# Patient Record
Sex: Female | Born: 1960
Health system: Southern US, Community
[De-identification: ages and names within clinical notes are randomized; demographics above are authoritative.]

## PROBLEM LIST (undated history)

## (undated) DIAGNOSIS — F32A Depression, unspecified: Secondary | ICD-10-CM

## (undated) DIAGNOSIS — E079 Disorder of thyroid, unspecified: Secondary | ICD-10-CM

## (undated) DIAGNOSIS — F329 Major depressive disorder, single episode, unspecified: Secondary | ICD-10-CM

## (undated) DIAGNOSIS — E538 Deficiency of other specified B group vitamins: Secondary | ICD-10-CM

---

## 1995-12-29 HISTORY — PX: LAPAROSCOPY: SHX197

## 1999-06-09 ENCOUNTER — Other Ambulatory Visit: Admission: RE | Admit: 1999-06-09 | Discharge: 1999-06-09 | Payer: Self-pay | Admitting: Gynecology

## 2001-06-14 ENCOUNTER — Other Ambulatory Visit: Admission: RE | Admit: 2001-06-14 | Discharge: 2001-06-14 | Payer: Self-pay | Admitting: Gynecology

## 2002-06-26 ENCOUNTER — Other Ambulatory Visit: Admission: RE | Admit: 2002-06-26 | Discharge: 2002-06-26 | Payer: Self-pay | Admitting: Gynecology

## 2002-10-02 ENCOUNTER — Other Ambulatory Visit: Admission: RE | Admit: 2002-10-02 | Discharge: 2002-10-02 | Payer: Self-pay | Admitting: Gynecology

## 2003-06-28 ENCOUNTER — Other Ambulatory Visit: Admission: RE | Admit: 2003-06-28 | Discharge: 2003-06-28 | Payer: Self-pay | Admitting: Gynecology

## 2003-10-04 ENCOUNTER — Ambulatory Visit (HOSPITAL_COMMUNITY): Admission: RE | Admit: 2003-10-04 | Discharge: 2003-10-04 | Payer: Self-pay | Admitting: Gynecology

## 2003-10-04 ENCOUNTER — Encounter: Payer: Self-pay | Admitting: Gynecology

## 2004-07-01 ENCOUNTER — Ambulatory Visit (HOSPITAL_BASED_OUTPATIENT_CLINIC_OR_DEPARTMENT_OTHER): Admission: RE | Admit: 2004-07-01 | Discharge: 2004-07-01 | Payer: Self-pay | Admitting: Gynecology

## 2004-07-01 ENCOUNTER — Encounter (INDEPENDENT_AMBULATORY_CARE_PROVIDER_SITE_OTHER): Payer: Self-pay | Admitting: *Deleted

## 2004-07-01 ENCOUNTER — Ambulatory Visit (HOSPITAL_COMMUNITY): Admission: RE | Admit: 2004-07-01 | Discharge: 2004-07-01 | Payer: Self-pay | Admitting: Gynecology

## 2004-08-12 ENCOUNTER — Other Ambulatory Visit: Admission: RE | Admit: 2004-08-12 | Discharge: 2004-08-12 | Payer: Self-pay | Admitting: Gynecology

## 2004-12-18 ENCOUNTER — Ambulatory Visit (HOSPITAL_COMMUNITY): Admission: RE | Admit: 2004-12-18 | Discharge: 2004-12-18 | Payer: Self-pay | Admitting: Gynecology

## 2005-08-14 ENCOUNTER — Other Ambulatory Visit: Admission: RE | Admit: 2005-08-14 | Discharge: 2005-08-14 | Payer: Self-pay | Admitting: Gynecology

## 2006-01-22 ENCOUNTER — Ambulatory Visit: Payer: Self-pay | Admitting: Internal Medicine

## 2006-01-29 ENCOUNTER — Ambulatory Visit: Payer: Self-pay | Admitting: Internal Medicine

## 2006-02-26 ENCOUNTER — Ambulatory Visit (HOSPITAL_COMMUNITY): Admission: RE | Admit: 2006-02-26 | Discharge: 2006-02-26 | Payer: Self-pay | Admitting: Gynecology

## 2006-08-18 ENCOUNTER — Other Ambulatory Visit: Admission: RE | Admit: 2006-08-18 | Discharge: 2006-08-18 | Payer: Self-pay | Admitting: Gynecology

## 2007-05-16 ENCOUNTER — Ambulatory Visit (HOSPITAL_COMMUNITY): Admission: RE | Admit: 2007-05-16 | Discharge: 2007-05-16 | Payer: Self-pay | Admitting: Gynecology

## 2007-06-15 ENCOUNTER — Ambulatory Visit: Payer: Self-pay | Admitting: Internal Medicine

## 2007-06-17 DIAGNOSIS — F329 Major depressive disorder, single episode, unspecified: Secondary | ICD-10-CM

## 2007-06-17 DIAGNOSIS — F339 Major depressive disorder, recurrent, unspecified: Secondary | ICD-10-CM | POA: Insufficient documentation

## 2007-06-17 DIAGNOSIS — E039 Hypothyroidism, unspecified: Secondary | ICD-10-CM | POA: Insufficient documentation

## 2007-09-01 ENCOUNTER — Other Ambulatory Visit: Admission: RE | Admit: 2007-09-01 | Discharge: 2007-09-01 | Payer: Self-pay | Admitting: Gynecology

## 2007-11-17 ENCOUNTER — Telehealth: Payer: Self-pay | Admitting: Internal Medicine

## 2008-01-09 ENCOUNTER — Telehealth: Payer: Self-pay | Admitting: Internal Medicine

## 2008-05-16 ENCOUNTER — Ambulatory Visit (HOSPITAL_COMMUNITY): Admission: RE | Admit: 2008-05-16 | Discharge: 2008-05-16 | Payer: Self-pay | Admitting: Gynecology

## 2011-05-15 NOTE — H&P (Signed)
NAME:  Laura Howell, Laura Howell                               ACCOUNT NO.:  [id number]   MEDICAL RECORD NO.:  [id number]                   PATIENT TYPE:  AMB   LOCATION:  [facility]                                 FACILITY:  [facility]   PHYSICIAN:  Ivor Costa. Farrel Gobble, M.D.              DATE OF BIRTH:  17-Nov-1961   DATE OF ADMISSION:  DATE OF DISCHARGE:                                HISTORY & PHYSICAL   CHIEF COMPLAINT:  1. Chronic left lower quadrant pain.  2. History of endometriosis.   HISTORY OF PRESENT ILLNESS:  The patient is a 50 year old G0 who was  diagnosed with endometriosis by laparoscopy in 1990.  She had been  suppressed on Micronor and Depo-Provera and then was most recently switched  over to Alesse several years ago and had done very well.  However, the  patient has been having this chronic left lower quadrant pain that she has  been complaining of for the past several years after review of the chart.  The patient had been on continuous OC's and discontinued them in an attempt  to get a cycle and then forget to restart them for over 2-and-a-hafl weeks  and at the point when she presented to our office she had restarted the pill  but was still continuing to have the left lower quadrant pain which was  actually quite severe at that point.  She also had complained of some  bloating.  An ultrasound done at that time failed to show any ovarian cyst  on the left.  There is some posterior cul-de-sac fluid which is recurrent on  all of her scans dating back to 1999 which would be consistent with the  disease.  She is nulliparous by choice.   PAST OBSTETRICAL AND GYNECOLOGICAL HISTORY:  As above.  Her cycles have been  completely suppressed for a number of years.  She has no history of abnormal  Pap smears.   PAST MEDICAL HISTORY:  Significant for hypothyroidism, depression, and  endometriosis.   PAST SURGICAL HISTORY:  Significant for the laparoscopy in 1990.   SOCIAL HISTORY:  She is married.   No alcohol, tobacco, or caffeine.  She  does regular exercise.   FAMILY HISTORY:  Negative for GYN cancers.   MEDICATIONS:  She is on:  1. Wellbutrin SR 150.  2. Lexapro 10 mg daily.  3. Alesse one p.o. daily.  4. Trazodone 50 mg p.o. at h.s..  5. Ambien 10 mg p.o. at has p.r.n.  6. Unithroid 0.088.   PHYSICAL EXAMINATION:  GENERAL:  She is a well-appearing female in no acute  distress.  HEART:  Regular rate.  LUNGS:  Clear to auscultation.  ABDOMEN:  Soft with some tenderness in the left lower quadrant without any  rebound or guarding.  PELVIC:  She has normal external female genitalia.  The BUS is negative.  In  the vagina there was  a scant amount of discharge.  The speculum exam was  uncomfortable to her.  On bimanual exam there is some slight cervical motion  tenderness.  The uterus itself was nontender.  The right adnexa and sidewall  were also nontender.  There is tenderness on the left side.  Rectovaginal  exam confirmed a lack of symptoms on the right including the right  uterosacral ligament which were both nontender.  However, the left  uterosacral ligament was tender.  There was a band pulling from the sidewall  that was extremely painful to her on the left side.  The left ovary was also  tender.   ASSESSMENT:  Known history of endometriosis with chronic history of left  lower quadrant pain that has been exacerbated in recent weeks without any  resolution of her symptoms with return to oral contraceptives.  The patient  will present for a laparoscopic fulguration of endometriosis and removal of  the left adnexa.  All questions were addressed.                                               Ivor Costa. Farrel Gobble, M.D.    Leda Roys  D:  06/26/2004  T:  06/26/2004  Job:  [id number]

## 2011-05-15 NOTE — Op Note (Signed)
NAME:  Laura Howell, Laura Howell                               ACCOUNT NO.:  [id number]   MEDICAL RECORD NO.:  [id number]                   PATIENT TYPE:  AMB   LOCATION:  [facility]                                 FACILITY:  [facility]   PHYSICIAN:  Ivor Costa. Farrel Gobble, M.D.              DATE OF BIRTH:  18-Apr-1961   DATE OF PROCEDURE:  07/01/2004  DATE OF DISCHARGE:                                 OPERATIVE REPORT   PREOPERATIVE DIAGNOSIS:  1. Endometriosis.  2. Chronic left lower quadrant pain.   POSTOPERATIVE DIAGNOSES:  1. Endometriosis.  2. Chronic left lower quadrant pain.   PROCEDURE:  1. Laparoscopic left salpingo-oophorectomy.  2. Excision of endometriotic implants.   SURGEON:  Ivor Costa. Farrel Gobble, M.D.   ASSISTANTMarcial Pacas P. Fontaine, M.D.   ANESTHESIA:  General.   ESTIMATED BLOOD LOSS:  Minimal.   FINDINGS:  There were deep endometriotic implants bilaterally over the  uterosacral ligaments, with the left more involved than the right.  Normal  ureteral peristalsis is appreciated.  Both adnexa were mobile.  There was a  questionable small lesion on the right ovary.  The anterior cul-de-sac,  liver, gallbladder and appendix were normal.   PROCEDURE:  The patient was taken to the operating room.  General anesthesia  was induced.  He was placed in the dorsal lithotomy position and prepped and  draped in the usual sterile fashion.  A sterile speculum was placed in the  vagina.  The cervix was visualized, stabilized with a single-toothed  tenaculum and the uterine manipulator was then placed.   Gloves were changed and attention was then turned to the abdomen.  An  infraumbilical incision was made with the scalpel, through which the Veress  needle was placed.  Free flow of fluid was noted.  Opening pressure was 2.  A pneumoperitoneum was created until tympany was appreciated above the  liver, after which a 10-11 disposable trocar was inserted through the  infraumbilical port.  Placement in the  abdomen was confirmed.  Then, two 5  mm lower ports were placed under direct visualization, using these two lower  ports we were able to manipulate and examine the pelvis.  The tube and ovary  on both sides were noted to be mobile.  There were deep endometriotic  implants bilaterally over the uterosacral ligaments, with some banding noted  on the left-hand side.  Because of the exquisite tenderness of the left hand  side, and the band that was appreciated on the examination in the office, I  elected to sharply excise both of these lesions.  The ureter was visualized  peristalsing and well superior to this area.  The endometriotic implant was  then grasped with an atraumatic grasper, elevated and sharply excised off  with the cautery and shears.  Similarly, the endometriotic implant of the  right uterosacral ligament was treated, again peristalsis was appreciated  and  prior to the excision.  Both excisions were noted to be hemostatic  afterwards.   Because we had agreed upon removing the left ovary, regardless of findings,  we then proceeded to remove the left ovary.  The mesosalpinx tube and  ovarian-uterine ligament were treated with cautery, and then sharply excised  with the shears.  The infundibular pelvic ligament was also cauterized and  then sharply dissected off.  This was carried through until the entire tube  and ovary were removed.  The pedicle was noted to be hemostatic throughout.  An Endo pouch was then advanced through the infraumbilical port.  A #5 scope  was then advanced through the one of the lower ports, and we were able to  place the specimen in the bag and remove it from the pelvis.  The  infraumbilical port was then replaced.   Confirmation of hemostasis at all operative sites was appreciated.  The  pelvis was irrigated with copious amounts of warm saline.  The upper abdomen  was inspected and noted to be unremarkable.  The instruments were then  removed under  direct visualization.  The fascia of the infraumbilical port  was closed with figure-of-eight of 0 Vicryl.  The skin was closed with 3-0  plain.  The lower ports were reapproximated with Steri-Strips with tincture  of Benzoin.   DISPOSITION:  The patient tolerated the procedure well.  Sponge, lap and  needle counts were correct x2.  She was transferred to the PACU in stable  condition.                                               Ivor Costa. Farrel Gobble, M.D.    Leda Roys  D:  07/01/2004  T:  07/01/2004  Job:  [id number]

## 2018-02-16 ENCOUNTER — Emergency Department (HOSPITAL_BASED_OUTPATIENT_CLINIC_OR_DEPARTMENT_OTHER): Payer: BLUE CROSS/BLUE SHIELD

## 2018-02-16 ENCOUNTER — Emergency Department (HOSPITAL_BASED_OUTPATIENT_CLINIC_OR_DEPARTMENT_OTHER)
Admission: EM | Admit: 2018-02-16 | Discharge: 2018-02-17 | Disposition: A | Discharge status: Home / Self Care | Payer: BLUE CROSS/BLUE SHIELD | Attending: Emergency Medicine | Admitting: Emergency Medicine

## 2018-02-16 ENCOUNTER — Encounter (HOSPITAL_BASED_OUTPATIENT_CLINIC_OR_DEPARTMENT_OTHER): Payer: Self-pay

## 2018-02-16 ENCOUNTER — Other Ambulatory Visit: Payer: Self-pay

## 2018-02-16 DIAGNOSIS — Z79899 Other long term (current) drug therapy: Secondary | ICD-10-CM | POA: Insufficient documentation

## 2018-02-16 DIAGNOSIS — Y92018 Other place in single-family (private) house as the place of occurrence of the external cause: Secondary | ICD-10-CM | POA: Insufficient documentation

## 2018-02-16 DIAGNOSIS — S92531B Displaced fracture of distal phalanx of right lesser toe(s), initial encounter for open fracture: Secondary | ICD-10-CM | POA: Diagnosis not present

## 2018-02-16 DIAGNOSIS — Y9389 Activity, other specified: Secondary | ICD-10-CM | POA: Insufficient documentation

## 2018-02-16 DIAGNOSIS — E039 Hypothyroidism, unspecified: Secondary | ICD-10-CM | POA: Insufficient documentation

## 2018-02-16 DIAGNOSIS — W208XXA Other cause of strike by thrown, projected or falling object, initial encounter: Secondary | ICD-10-CM | POA: Insufficient documentation

## 2018-02-16 DIAGNOSIS — Y999 Unspecified external cause status: Secondary | ICD-10-CM | POA: Diagnosis not present

## 2018-02-16 DIAGNOSIS — S99921A Unspecified injury of right foot, initial encounter: Secondary | ICD-10-CM | POA: Diagnosis present

## 2018-02-16 DIAGNOSIS — Z23 Encounter for immunization: Secondary | ICD-10-CM | POA: Insufficient documentation

## 2018-02-16 DIAGNOSIS — S91114A Laceration without foreign body of right lesser toe(s) without damage to nail, initial encounter: Secondary | ICD-10-CM | POA: Diagnosis not present

## 2018-02-16 HISTORY — DX: Major depressive disorder, single episode, unspecified: F32.9

## 2018-02-16 HISTORY — DX: Disorder of thyroid, unspecified: E07.9

## 2018-02-16 HISTORY — DX: Deficiency of other specified B group vitamins: E53.8

## 2018-02-16 HISTORY — DX: Depression, unspecified: F32.A

## 2018-02-16 LAB — CBC WITH DIFFERENTIAL/PLATELET
BASOS ABS: 0.1 10*3/uL (ref 0.0–0.1)
Basophils Relative: 1 %
Eosinophils Absolute: 0.1 10*3/uL (ref 0.0–0.7)
Eosinophils Relative: 2 %
HEMATOCRIT: 39.4 % (ref 36.0–46.0)
Hemoglobin: 13.5 g/dL (ref 12.0–15.0)
LYMPHS ABS: 1.7 10*3/uL (ref 0.7–4.0)
LYMPHS PCT: 32 %
MCH: 34 pg (ref 26.0–34.0)
MCHC: 34.3 g/dL (ref 30.0–36.0)
MCV: 99.2 fL (ref 78.0–100.0)
MONO ABS: 0.7 10*3/uL (ref 0.1–1.0)
MONOS PCT: 13 %
Neutro Abs: 2.7 10*3/uL (ref 1.7–7.7)
Neutrophils Relative %: 52 %
Platelets: 320 10*3/uL (ref 150–400)
RBC: 3.97 MIL/uL (ref 3.87–5.11)
RDW: 11.9 % (ref 11.5–15.5)
WBC: 5.3 10*3/uL (ref 4.0–10.5)

## 2018-02-16 LAB — BASIC METABOLIC PANEL
ANION GAP: 10 (ref 5–15)
BUN: 9 mg/dL (ref 6–20)
CALCIUM: 9.2 mg/dL (ref 8.9–10.3)
CO2: 22 mmol/L (ref 22–32)
Chloride: 103 mmol/L (ref 101–111)
Creatinine, Ser: 0.81 mg/dL (ref 0.44–1.00)
GFR calc Af Amer: >60 mL/min (ref >60)
GFR calc non Af Amer: >60 mL/min (ref >60)
GLUCOSE: 101 mg/dL — AB (ref 65–99)
Potassium: 4.4 mmol/L (ref 3.5–5.1)
Sodium: 135 mmol/L (ref 135–145)

## 2018-02-16 MED ORDER — ACETAMINOPHEN 500 MG PO TABS: 1000.0000 mg | ORAL_TABLET | Freq: Three times a day (TID) | ORAL | 0 refills | Status: AC

## 2018-02-16 MED ORDER — TETANUS-DIPHTH-ACELL PERTUSSIS 5-2.5-18.5 LF-MCG/0.5 IM SUSP
0.5000 mL | Freq: Once | INTRAMUSCULAR | Status: AC
Start: 2018-02-16 — End: 2018-02-16
  Administered 2018-02-16: 0.5 mL via INTRAMUSCULAR
  Filled 2018-02-16: qty 0.5

## 2018-02-16 MED ORDER — SODIUM CHLORIDE 0.9 % IV BOLUS (SEPSIS)
1000.0000 mL | Freq: Once | INTRAVENOUS | Status: AC
  Administered 2018-02-16: 1000 mL via INTRAVENOUS

## 2018-02-16 MED ORDER — CEFAZOLIN SODIUM-DEXTROSE 1-4 GM/50ML-% IV SOLN
1.0000 g | Freq: Once | INTRAVENOUS | Status: AC
Start: 2018-02-16 — End: 2018-02-16
  Administered 2018-02-16: 1 g via INTRAVENOUS
  Filled 2018-02-16: qty 50

## 2018-02-16 MED ORDER — LIDOCAINE HCL 2 % IJ SOLN: 10.0000 mL | Freq: Once | INTRAMUSCULAR | Status: DC

## 2018-02-16 MED ORDER — HYDROCODONE-ACETAMINOPHEN 5-325 MG PO TABS: 1.0000 | ORAL_TABLET | Freq: Three times a day (TID) | ORAL | 0 refills | Status: AC | PRN

## 2018-02-16 MED ORDER — CEPHALEXIN 500 MG PO CAPS: 500.0000 mg | ORAL_CAPSULE | Freq: Three times a day (TID) | ORAL | 0 refills | Status: AC

## 2018-02-16 MED ORDER — LIDOCAINE HCL 2 % IJ SOLN
INTRAMUSCULAR | Status: AC
  Filled 2018-02-16: qty 20

## 2018-02-16 MED ORDER — FENTANYL CITRATE (PF) 100 MCG/2ML IJ SOLN
50.0000 ug | Freq: Once | INTRAMUSCULAR | Status: AC
  Administered 2018-02-16: 50 ug via INTRAVENOUS
  Filled 2018-02-16: qty 2

## 2018-02-16 MED ORDER — ACETAMINOPHEN 500 MG PO TABS
1000.0000 mg | ORAL_TABLET | Freq: Once | ORAL | Status: AC
  Administered 2018-02-16: 1000 mg via ORAL
  Filled 2018-02-16: qty 2

## 2018-02-16 NOTE — ED Notes (Signed)
Pt on monitor 

## 2018-02-16 NOTE — ED Notes (Signed)
ED Provider at bedside. 

## 2018-02-16 NOTE — ED Provider Notes (Signed)
[facility] EMERGENCY DEPARTMENT Provider Note  CSN: [id number] Arrival date & time: 02/16/18 2015  Chief Complaint(s) Laceration  HPI Laura Howell is a 57 y.o. female who presents to the emergency department with injury to the right third toe 2 hours prior to arrival.  She reports that she was lowering her blinds when they came off the hinge and landed on her toe.  Patient felt immediate pain and noted significant bleeding.  Pain has now resolved.  Reports that she still has sensation to the toe.  Denies any other injuries denies any other physical complaints. Patient is not up-to-date on her tetanus vaccination.  HPI  Past Medical History Past Medical History:  Diagnosis Date  . B12 deficiency   . Depression   . Thyroid disease    Patient Active Problem List   Diagnosis Date Noted  . HYPOTHYROIDISM 06/17/2007  . DEPRESSION 06/17/2007   Home Medication(s) Prior to Admission medications   Medication Sig Start Date End Date Taking? Authorizing Provider  BuPROPion HCl (WELLBUTRIN PO) Take by mouth.   Yes [provider]  levothyroxine (SYNTHROID, LEVOTHROID) 125 MCG tablet Take 125 mcg by mouth daily before breakfast.   Yes [provider]  TRAZODONE HCL PO Take by mouth.   Yes [provider]  Vortioxetine HBr (TRINTELLIX PO) Take by mouth.   Yes [provider]  Zolpidem Tartrate (AMBIEN PO) Take by mouth.   Yes [provider]  acetaminophen (TYLENOL) 500 MG tablet Take 2 tablets (1,000 mg total) by mouth every 8 (eight) hours for 5 days. Do not take more than 4000 mg of acetaminophen (Tylenol) in a 24-hour period. Please note that other medicines that you may be prescribed may have Tylenol as well. 02/16/18 02/21/18  Tawnie Ehresman, Amadeo Garnet, MD  cephALEXin (KEFLEX) 500 MG capsule Take 1 capsule (500 mg total) by mouth 3 (three) times daily for 7 days. 02/16/18 02/23/18  Nira Conn, MD  HYDROcodone-acetaminophen  (NORCO/VICODIN) 5-325 MG tablet Take 1 tablet by mouth every 8 (eight) hours as needed for up to 5 days for severe pain (That is not improved by your scheduled acetaminophen regimen). Please do not exceed 4000 mg of acetaminophen (Tylenol) a 24-hour period. Please note that he may be prescribed additional medicine that contains acetaminophen. 02/16/18 02/21/18  Kamdyn Colborn, Amadeo Garnet, MD                                                                                                                                    Past Surgical History Past Surgical History:  Procedure Laterality Date  . LAPAROSCOPY  1997   endrometriosis   Family History No family history on file.  Social History Social History   Tobacco Use  . Smoking status: Never Smoker  . Smokeless tobacco: Never Used  Substance Use Topics  . Alcohol use: Yes    Comment: 2x/week  . Drug use: Not on file  Allergies Erythromycin and Tetracyclines & related  Review of Systems Review of Systems All other systems are reviewed and are negative for acute change except as noted in the HPI  Physical Exam Vital Signs  I have reviewed the triage vital signs BP 127/83 (BP Location: Right Arm)   Pulse 66   Temp 98.2 F (36.8 C) (Oral)   Resp 18   Ht 5\' 2"  (1.575 m)   Wt 61.2 kg (135 lb)   SpO2 100%   BMI 24.69 kg/m   Physical Exam  Constitutional: She is oriented to person, place, and time. She appears well-developed and well-nourished. No distress.  HENT:  Head: Normocephalic and atraumatic.  Right Ear: External ear normal.  Left Ear: External ear normal.  Nose: Nose normal.  Eyes: Conjunctivae and EOM are normal. No scleral icterus.  Neck: Normal range of motion and phonation normal.  Cardiovascular: Normal rate and regular rhythm.  Pulmonary/Chest: Effort normal. No stridor. No respiratory distress.  Abdominal: She exhibits no distension.  Musculoskeletal: Normal range of motion. She exhibits no edema.        Feet:  Neurological: She is alert and oriented to person, place, and time.  Skin: She is not diaphoretic.  Psychiatric: She has a normal mood and affect. Her behavior is normal.  Vitals reviewed.       ED Results and Treatments Labs (all labs ordered are listed, but only abnormal results are displayed) Labs Reviewed  BASIC METABOLIC PANEL - Abnormal; Notable for the following components:      Result Value   Glucose, Bld 101 (*)    All other components within normal limits  CBC WITH DIFFERENTIAL/PLATELET                                                                                                                         EKG  EKG Interpretation  Date/Time:  Wednesday February 16 2018 20:48:56 EST Ventricular Rate:  71 PR Interval:    QRS Duration: 92 QT Interval:  414 QTC Calculation: 450 R Axis:   81 Text Interpretation:  Sinus rhythm Baseline wander in lead(s) I, II, and III NO STEMI No old tracing to compare Confirmed by Drema Pry [phone]) on 02/16/2018 9:47:34 PM      Radiology Dg Foot Complete Right  Result Date: 02/16/2018 CLINICAL DATA:  Third toe laceration tonight.  Lines fell on foot. EXAM: RIGHT FOOT COMPLETE - 3+ VIEW COMPARISON:  None. FINDINGS: Oblique fracture of the third toe distal phalanx is displaced, minimally comminuted and extends to the medial aspect of the articular surface. Associated lucency extends to the skin surface suspicious for laceration and open fracture. No additional acute fracture of the foot. Tiny density just medial to the second metatarsal on AP view projects over the third metatarsal on the oblique view and may be external to the patient. IMPRESSION: Displaced fracture of the third toe distal phalanx with associated laceration, consistent with open fracture. Electronically Signed   By: Shawna Orleans  Ehinger M.D.   On: 02/16/2018 21:31   Pertinent labs & imaging results that were available during my care of the patient were reviewed by me  and considered in my medical decision making (see chart for details).  Medications Ordered in ED Medications  lidocaine (XYLOCAINE) 2 % (with pres) injection 200 mg (not administered)  lidocaine (XYLOCAINE) 2 % (with pres) injection (not administered)  fentaNYL (SUBLIMAZE) injection 50 mcg (50 mcg Intravenous Given 02/16/18 2046)  sodium chloride 0.9 % bolus 1,000 mL (0 mLs Intravenous Stopped 02/16/18 2323)  Tdap (BOOSTRIX) injection 0.5 mL (0.5 mLs Intramuscular Given 02/16/18 2048)  ceFAZolin (ANCEF) IVPB 1 g/50 mL premix (0 g Intravenous Stopped 02/16/18 2323)                                                                                                                                    Procedures ORTHOPEDIC INJURY TREATMENT Date/Time: 02/16/2018 11:31 PM Performed by: Nira Connardama, Crickett Abbett Eduardo, MD Authorized by: Nira Connardama, Hasheem Voland Eduardo, MD   Consent:    Consent obtained:  Verbal   Consent given by:  Patient   Risks discussed:  Fracture   Alternatives discussed:  No treatment, immobilization and delayed treatmentInjury location: toe Location details: right third toe Injury type: fracture Fracture type: distal phalanx Pre-procedure distal perfusion: diminished Pre-procedure neurological function: normal Pre-procedure range of motion: reduced Anesthesia: digital block  Anesthesia: Local anesthesia used: yes Local Anesthetic: lidocaine 2% without epinephrine Anesthetic total: 5 mL  Patient sedated: NoManipulation performed: yes Skeletal traction used: yes Reduction successful: yes X-ray confirmed reduction: yes Immobilization: cam walker. Post-procedure distal perfusion: diminished Post-procedure neurological function: absent Post-procedure neurological function comment: s/p digital block Post-procedure range of motion: unchanged Patient tolerance: Patient tolerated the procedure well with no immediate complications  .Marland Kitchen.Laceration Repair Date/Time: 02/16/2018 11:33  PM Performed by: Nira Connardama, Jonnie Truxillo Eduardo, MD Authorized by: Nira Connardama, Emi Lymon Eduardo, MD   Consent:    Consent obtained:  Verbal   Consent given by:  Patient   Risks discussed:  Infection, poor cosmetic result and poor wound healing   Alternatives discussed:  No treatment and delayed treatment Anesthesia (see MAR for exact dosages):    Anesthesia method:  Nerve block   Block location:  Digital   Block needle gauge:  25 G   Block anesthetic:  Lidocaine 2% w/o epi   Block injection procedure:  Anatomic landmarks identified Laceration details:    Location:  Toe   Toe location:  R third toe   Length (cm):  5   Depth (mm):  1.5 Repair type:    Repair type:  Complex Pre-procedure details:    Preparation:  Patient was prepped and draped in usual sterile fashion and imaging obtained to evaluate for foreign bodies Exploration:    Wound exploration: wound explored through full range of motion and entire depth of wound probed and visualized     Wound extent: fascia violated, muscle damage, tendon  damage, underlying fracture and vascular damage     Wound extent: no foreign bodies/material noted     Tendon damage extent:  Partial transection   Contaminated: no   Treatment:    Area cleansed with:  Betadine   Amount of cleaning:  Extensive   Irrigation solution:  Sterile saline   Irrigation volume:  1000   Irrigation method:  Syringe   Visualized foreign bodies/material removed: no     Debridement:  Minimal   Undermining:  Minimal Skin repair:    Repair method:  Sutures   Suture size:  4-0   Wound skin closure material used: ethilon.   Suture technique:  Vertical mattress   Number of sutures:  6 Approximation:    Approximation:  Loose   Vermilion border: well-aligned   Post-procedure details:    Dressing:  Non-adherent dressing and bulky dressing   Patient tolerance of procedure:  Tolerated well, no immediate complications    (including critical care time)  Medical Decision  Making / ED Course I have reviewed the nursing notes for this encounter and the patient's prior records (if available in EHR or on provided paperwork).    Plain film confirmed partial amputation of the right third toe.  Tetanus updated.  Ancef ordered.  Screening labs obtained.  Will discuss case with orthopedic surgery for further recommendations.  Spoke with Dr. Luiz Blare who recommended primary closure with loose sutures and splinting. He will see patient in the clinic tomorrow for close follow up.   Wound was thoroughly irrigated and closed as above.  Final Clinical Impression(s) / ED Diagnoses Final diagnoses:  Displaced fracture of distal phalanx of right lesser toe(s), initial encounter for open fracture  Laceration of lesser toe of right foot without foreign body present or damage to nail, initial encounter    Disposition: Discharge  Condition: Good  I have discussed the results, Dx and Tx plan with the patient who expressed understanding and agree(s) with the plan. Discharge instructions discussed at great length. The patient was given strict return precautions who verbalized understanding of the instructions. No further questions at time of discharge.    ED Discharge Orders        Ordered    acetaminophen (TYLENOL) 500 MG tablet  Every 8 hours     02/16/18 2338    HYDROcodone-acetaminophen (NORCO/VICODIN) 5-325 MG tablet  Every 8 hours PRN     02/16/18 2338    cephALEXin (KEFLEX) 500 MG capsule  3 times daily     02/16/18 2338       Follow Up: Jodi Geralds, MD [address] [place] [address] [phone]  In 1 day For close follow up to assess for toe fracture and laceration     This chart was dictated using voice recognition software.  Despite best efforts to proofread,  errors can occur which can change the documentation meaning.   Nira Conn, MD 02/16/18 [phone]

## 2018-02-16 NOTE — ED Triage Notes (Signed)
Pt was pulling up some blinds and the entire thing fell on to her right foot.  Pt has vertical laceration down middle second toe on right foot, near amputation and possible open fracture

## 2018-02-16 NOTE — ED Notes (Signed)
Patient transported to X-ray 

## 2018-02-17 NOTE — ED Notes (Signed)
Pt discharged to home with neighbor. NAD.

## 2018-02-17 NOTE — ED Notes (Addendum)
Dressing has bloody show. Dressing to right foot changed. Cam walker placed back on right foot after dressing changed.

## 2018-10-28 ENCOUNTER — Encounter: Payer: Self-pay | Admitting: Family Medicine

## 2018-10-28 ENCOUNTER — Ambulatory Visit (INDEPENDENT_AMBULATORY_CARE_PROVIDER_SITE_OTHER): Payer: BLUE CROSS/BLUE SHIELD | Admitting: Family Medicine

## 2018-10-28 VITALS — BP 122/80 | HR 60 | Temp 98.5°F | Resp 16 | Ht 62.0 in | Wt 126.2 lb

## 2018-10-28 DIAGNOSIS — E538 Deficiency of other specified B group vitamins: Secondary | ICD-10-CM

## 2018-10-28 DIAGNOSIS — Z131 Encounter for screening for diabetes mellitus: Secondary | ICD-10-CM

## 2018-10-28 DIAGNOSIS — F1010 Alcohol abuse, uncomplicated: Secondary | ICD-10-CM

## 2018-10-28 DIAGNOSIS — E039 Hypothyroidism, unspecified: Secondary | ICD-10-CM | POA: Diagnosis not present

## 2018-10-28 DIAGNOSIS — Z8659 Personal history of other mental and behavioral disorders: Secondary | ICD-10-CM | POA: Diagnosis not present

## 2018-10-28 DIAGNOSIS — Z7689 Persons encountering health services in other specified circumstances: Secondary | ICD-10-CM | POA: Diagnosis not present

## 2018-10-28 DIAGNOSIS — R748 Abnormal levels of other serum enzymes: Secondary | ICD-10-CM

## 2018-10-28 LAB — CBC WITH DIFFERENTIAL/PLATELET
Basophils Absolute: 0.1 10*3/uL (ref 0.0–0.1)
Basophils Relative: 1.5 % (ref 0.0–3.0)
EOS PCT: 1.3 % (ref 0.0–5.0)
Eosinophils Absolute: 0.1 10*3/uL (ref 0.0–0.7)
HEMATOCRIT: 42.6 % (ref 36.0–46.0)
Hemoglobin: 14.3 g/dL (ref 12.0–15.0)
LYMPHS ABS: 1.7 10*3/uL (ref 0.7–4.0)
LYMPHS PCT: 27.5 % (ref 12.0–46.0)
MCHC: 33.5 g/dL (ref 30.0–36.0)
MCV: 100.8 fl — AB (ref 78.0–100.0)
MONOS PCT: 12 % (ref 3.0–12.0)
Monocytes Absolute: 0.7 10*3/uL (ref 0.1–1.0)
NEUTROS ABS: 3.5 10*3/uL (ref 1.4–7.7)
Neutrophils Relative %: 57.7 % (ref 43.0–77.0)
Platelets: 328 10*3/uL (ref 150.0–400.0)
RBC: 4.23 Mil/uL (ref 3.87–5.11)
RDW: 12.4 % (ref 11.5–15.5)
WBC: 6 10*3/uL (ref 4.0–10.5)

## 2018-10-28 LAB — COMPREHENSIVE METABOLIC PANEL
ALBUMIN: 4.4 g/dL (ref 3.5–5.2)
ALK PHOS: 55 U/L (ref 39–117)
ALT: 104 U/L — AB (ref 0–35)
AST: 103 U/L — ABNORMAL HIGH (ref 0–37)
BILIRUBIN TOTAL: 0.8 mg/dL (ref 0.2–1.2)
BUN: 10 mg/dL (ref 6–23)
CO2: 27 mEq/L (ref 19–32)
CREATININE: 0.77 mg/dL (ref 0.40–1.20)
Calcium: 9.8 mg/dL (ref 8.4–10.5)
Chloride: 104 mEq/L (ref 96–112)
GFR: 82.42 mL/min (ref >60.00)
GLUCOSE: 84 mg/dL (ref 70–99)
POTASSIUM: 4.5 meq/L (ref 3.5–5.1)
SODIUM: 140 meq/L (ref 135–145)
TOTAL PROTEIN: 7 g/dL (ref 6.0–8.3)

## 2018-10-28 LAB — FOLATE: Folate: 13.1 ng/mL (ref >5.9)

## 2018-10-28 LAB — PHOSPHORUS: Phosphorus: 4.6 mg/dL (ref 2.3–4.6)

## 2018-10-28 LAB — TSH: TSH: 0.07 u[IU]/mL — ABNORMAL LOW (ref 0.35–4.50)

## 2018-10-28 LAB — VITAMIN B12: Vitamin B-12: 325 pg/mL (ref 211–911)

## 2018-10-28 LAB — MAGNESIUM: MAGNESIUM: 2 mg/dL (ref 1.5–2.5)

## 2018-10-28 MED ORDER — CYANOCOBALAMIN 1000 MCG/ML IJ SOLN
1000.0000 ug | Freq: Once | INTRAMUSCULAR | Status: AC
  Administered 2018-10-28: 1000 ug via INTRAMUSCULAR

## 2018-10-28 NOTE — Progress Notes (Signed)
Patient presents to clinic today to f/u on chronic issues and establish care.  SUBJECTIVE: PMH:  Pt is a 57 yo female with pmh sig for h/o depression, hypothyroidism, B12 deficiency.  Pt was previously seen in [place], [place].  H/o B12 def: -was receiving monthly injections -has not had in months -is due for injection  H/o Depression: -pt s/p transcranial magnetic stimulation (TMS) -states is much better after the treatment -endorses good mood   Hypothyroidism: -Patient taking levothyroxine 125 mcg daily -Unsure of last TSH check.  Alcohol abuse: -Pt states she has a problem with alcohol. -Endorses drinking 1 bottle of wine per night -Starting to notice tremors in her hands. -Pt states her husband is worried about her. -Interested in treatment options.  Does not want to go to AA as concerned she may see someone she knows.  Allergies: Erythromycin--rash Tetracycline-- unsure, happened as a child.  Social hx: Pt is married. She is employed as a Production manager.  Pt moved to [place] with her husband d/t his job.  Pt endores EtOH use.  States may drink 3/4-1 bottle of wine per night.  Pt denies tobacco and drug use.   Health Maintenance: Immunizations - influenza 2019 Mammogram --2018 PAP -- 2018  Past Medical History:  Diagnosis Date  . B12 deficiency   . Depression   . Thyroid disease     Past Surgical History:  Procedure Laterality Date  . LAPAROSCOPY  1997   endrometriosis    Current Outpatient Medications on File Prior to Visit  Medication Sig Dispense Refill  . buPROPion (WELLBUTRIN) 100 MG tablet Take 100 mg by mouth daily.  2  . diclofenac sodium (VOLTAREN) 1 % GEL APPLY 2-4 GRAMS TO AFFECTED ARE TWICE A DAY  0  . levothyroxine (SYNTHROID, LEVOTHROID) 125 MCG tablet Take 125 mcg by mouth daily before breakfast.    . mometasone (NASONEX) 50 MCG/ACT nasal spray INSTILL 1 SPRAY INTO EACH NOSTRIL ONCE DAILY  1  . traZODone (DESYREL) 100 MG tablet Take 50 mg by mouth at  bedtime.  2  . TRINTELLIX 20 MG TABS tablet Take 20 mg by mouth daily.  2  . zolpidem (AMBIEN) 10 MG tablet TAKE 1 TABLET BY MOUTH AT BEDTIME AS NEEDED FOR INSOMNIA  0   No current facility-administered medications on file prior to visit.     Allergies  Allergen Reactions  . Erythromycin Rash  . Tetracyclines & Related Rash    History reviewed. No pertinent family history.  Social History   Socioeconomic History  . Marital status: Married    Spouse name: Not on file  . Number of children: Not on file  . Years of education: Not on file  . Highest education level: Not on file  Occupational History  . Not on file  Social Needs  . Financial resource strain: Not on file  . Food insecurity:    Worry: Not on file    Inability: Not on file  . Transportation needs:    Medical: Not on file    Non-medical: Not on file  Tobacco Use  . Smoking status: Never Smoker  . Smokeless tobacco: Never Used  Substance and Sexual Activity  . Alcohol use: Yes    Comment: 2x/week  . Drug use: Never  . Sexual activity: Yes  Lifestyle  . Physical activity:    Days per week: Not on file    Minutes per session: Not on file  . Stress: Not on file  Relationships  .  Social connections:    Talks on phone: Not on file    Gets together: Not on file    Attends religious service: Not on file    Active member of club or organization: Not on file    Attends meetings of clubs or organizations: Not on file    Relationship status: Not on file  . Intimate partner violence:    Fear of current or ex partner: Not on file    Emotionally abused: Not on file    Physically abused: Not on file    Forced sexual activity: Not on file  Other Topics Concern  . Not on file  Social History Narrative  . Not on file    ROS General: Denies fever, chills, night sweats, changes in weight, changes in appetite HEENT: Denies headaches, ear pain, changes in vision, rhinorrhea, sore throat CV: Denies CP,  palpitations, SOB, orthopnea Pulm: Denies SOB, cough, wheezing GI: Denies abdominal pain, nausea, vomiting, diarrhea, constipation GU: Denies dysuria, hematuria, frequency, vaginal discharge Msk: Denies muscle cramps, joint pains Neuro: Denies weakness, numbness, tingling Skin: Denies rashes, bruising Psych: Denies anxiety, hallucinations  +H/o depression, alcoholism  BP 122/80   Pulse 60   Temp 98.5 F (36.9 C)   Resp 16   Ht 5\' 2"  (1.575 m)   Wt 126 lb 4 oz (57.3 kg)   SpO2 97%   BMI 23.09 kg/m   Physical Exam Gen. Pleasant, well developed, well-nourished, in NAD HEENT - [place]/AT, PERRL, no scleral icterus, no nasal drainage, pharynx without erythema or exudate. Lungs: no use of accessory muscles, CTAB, no wheezes, rales or rhonchi Cardiovascular: RRR, No r/g/m, no peripheral edema Abdomen: BS present, soft, nontender,nondistended, no hepatosplenomegaly Neuro:  A&Ox3, CN II-XII intact, normal gait Skin:  Warm, dry, intact, no lesions, no spider telangiectasias  No results found for this or any previous visit (from the past 2160 hour(s)).  Assessment/Plan: B12 deficiency  - Plan: CBC with Differential/Platelet, Vitamin B12, cyanocobalamin ((VITAMIN B-12)) injection 1,000 mcg  History of depression  - Plan: TSH  Acquired hypothyroidism  - Plan: TSH, levothyroxine (SYNTHROID, LEVOTHROID) 112 MCG tablet  Encounter to establish care -We reviewed the PMH, PSH, FH, SH, Meds and Allergies. -We provided refills for any medications we will prescribe as needed. -We addressed current concerns per orders and patient instructions. -We have asked for records for pertinent exams, studies, vaccines and notes from previous providers. -We have advised patient to follow up per instructions below.  Screening for diabetes mellitus -hgb A1C  ETOH abuse  -given info on area rehab programs -advised against stopping EtOH intake cold [place]. - Plan: Folate, Comprehensive metabolic panel,  Magnesium, Phosphorus  Update:  LFTs were mildly elevated and TSH was low.  Pt made aware of this via phone  Levothyroxine dose lowered to 112 mcg daily.  Will recheck TSH in 6 wks.  B12 level was normal prior to receiving injection.  Abnormal liver enzymes - Plan: US Abdomen Limited RUQ  F/u in 6 wks  Abbe Amsterdam, MD

## 2018-10-28 NOTE — Patient Instructions (Addendum)
Caring for Your Mental Health Mental health is emotional, psychological, and social well-being. Mental health is just as important as physical health. In fact, mental and physical health are connected, and you need both to be healthy. Some signs of good mental health (well-being) include:  Being able to attend to tasks at home, school, or work.  Being able to manage stress and emotions.  Practicing self-care, which may include: ? A regular exercise pattern. ? A reasonably healthy diet. ? Supportive and trusting relationships. ? The ability to relax and calm yourself (self-calm).  Having pleasurable hobbies and activities to do.  Believing that you have meaning and purpose in your life.  Recovering and adjusting after facing challenges (resilience).  You can take steps to build or strengthen these mentally healthy behaviors. There are resources and support to help you with this. Why is caring for mental health important? Caring for your mental health is a big part of staying healthy. Everyone has times when feelings, thoughts, or situations feel overwhelming. Mental health means having the skills to manage what feels overwhelming. If this sense of being overwhelmed persists, however, you might need some help. If you have some of the following signs, you may need to take better care of your mental health or seek help from a health care provider or mental health professional:  Problems with energy or focus.  Changes in eating habits.  Problems sleeping, such as sleeping too much or not enough.  Emotional distress, such as anger, sadness, depression, or anxiety.  Major changes in your relationships.  Losing interest in life or activities that you used to enjoy.  If you have any of these symptoms on most days for 2 weeks or longer:  Talk with a close friend or family member about how you are feeling.  Contact your health care provider to discuss your symptoms.  Consider working  with a Financial trader. Your health care provider, family, or friends may be able to recommend a therapist.  What can I do to promote emotional and mental health? Managing emotions  Learn to identify emotions and deal with them. Recognizing your emotions is the first step in learning to deal with them.  Practice ways to appropriately express feelings. Remember that you can control your feelings. They do not control you.  Practice stress management techniques, such as: ? Relaxation techniques, like breathing or muscle relaxation exercises. ? Exercise. Regular activity can lower your stress level. ? Changing what you can change and accepting what you cannot change.  Build up your resilience so that you can recover and adjust after big problems or challenges. Practice resilient behaviors and attitudes: ? Set and focus on long-term goals. ? Develop and maintain healthy, supportive relationships. ? Learn to accept change and make the best of the situation. ? Take care of yourself physically by eating a healthy diet, getting plenty of sleep, and exercising regularly. ? Develop self-awareness. Ask others to give feedback about how they see you. ? Practice mindfulness meditation to help you stay calm when dealing with daily challenges. ? Learn to respond to situations in healthy ways, rather than reacting with your emotions. ? Keep a positive attitude, and believe in yourself. Your view of yourself affects your mental health. ? Develop your listening and empathy skills. These will help you deal with difficult situations and communications.  Remember that emotions can be used as a good source of communication and are a great source of energy. Try to laugh and find  humor in life. Sleeping  Get the right amount and quality of sleep. Sleep has a big impact on physical and mental health. To improve your sleep: ? Go to bed and wake up around the same time every day. ? Limit screen time  before bedtime. This includes the use of your cell phone, TV, computer, and tablet. ? Keep your bedroom dark and cool. Activity  Exercise or do some physical activity regularly. This helps: ? Keep your body strong, especially during times of stress. ? Get rid of chemicals in your body (hormones) that build up when you are stressed. ? Build up your resilience. Eating and drinking  Eat a healthy diet that includes whole grains, vegetables, fresh fruits, and lean proteins. If you have questions about what foods are best for you, ask your health care provider.  Try not to turn to sweet, salty, or otherwise unhealthy foods when you are tired or unhappy. This can lead to unwanted weight gain and is not a healthy way to cope with emotions. Where to find more information: You can find more information about how to care for your mental health from:  The [employer] on Mental Illness (NAMI): www.nami.[employer] of Mental Health: http://www.maynard.net/  Centers for Disease Control and Prevention: https://www.washington.net/  Contact a health care provider if:  You lose interest in being with others or you do not want to leave the house.  You have a hard time completing your normal activities or you have less energy than normal.  You cannot stay focused or you have problems with memory.  You feel that your senses are heightened, and this makes you upset or concerned.  You feel nervous or have rapid mood changes.  You are sleeping or eating more or less than normal.  You question reality or you show odd behavior that disturbs you or others. Get help right away if:  You have thoughts about hurting yourself or others. If you ever feel like you may hurt yourself or others, or have thoughts about taking your own life, get help right away. You can go to your nearest emergency department or call:  Your local emergency services (911 in the [place]  A suicide crisis helpline, such  as the National Suicide Prevention Lifeline at 364-824-6047. This is open 24 hours a day.  Summary  Mental health is not just the absence of mental illness. It involves understanding your emotions and behaviors, and taking steps to cope with them in a healthy way.  If you have symptoms of mental or emotional distress, get help from family, friends, a health care provider, or a mental health professional.  Practice good mental health behaviors such as stress management skills, self-calming skills, exercise, and healthy sleeping and eating. This information is not intended to replace advice given to you by your health care provider. Make sure you discuss any questions you have with your health care provider. Document Released: 04/27/2017 Document Revised: 04/27/2017 Document Reviewed: 04/27/2017 Elsevier Interactive Patient Education  2018 [employer].  Vitamin B12 Deficiency Vitamin B12 deficiency means that your body is not getting enough vitamin B12. Your body needs vitamin B12 for important bodily functions. If you do not have enough vitamin B12 in your body, you can have health problems. Follow these instructions at home:  Take supplements only as told by your doctor. Follow the directions carefully.  Get any shots (injections) as told by your doctor. Do not miss your visits to the doctor.  Eat lots of  healthy foods that contain vitamin B12. Ask your doctor if you should work with someone who is trained in how food affects health (dietitian). Foods that contain vitamin B12 include: ? Meat. ? Meat from birds (poultry). ? Fish. ? Eggs. ? Cereal and dairy products that are fortified. This means that vitamin B12 has been added to the food. Check the label on the package to see if the food is fortified.  Do not drink too much (do not abuse) alcohol.  Keep all follow-up visits as told by your doctor. This is important. Contact a doctor if:  Your symptoms come back. Get help right  away if:  You have trouble breathing.  You have chest pain.  You get dizzy.  You pass out (lose consciousness). This information is not intended to replace advice given to you by your health care provider. Make sure you discuss any questions you have with your health care provider. Document Released: 12/03/2011 Document Revised: 05/21/2016 Document Reviewed: 05/01/2015 Elsevier Interactive Patient Education  [employer].

## 2018-10-31 MED ORDER — LEVOTHYROXINE SODIUM 112 MCG PO TABS: 112.0000 ug | ORAL_TABLET | Freq: Every day | ORAL | 3 refills | Status: DC

## 2018-11-03 ENCOUNTER — Encounter: Payer: Self-pay | Admitting: Family Medicine

## 2018-12-13 ENCOUNTER — Other Ambulatory Visit: Payer: Self-pay | Admitting: Family Medicine

## 2018-12-13 ENCOUNTER — Other Ambulatory Visit (INDEPENDENT_AMBULATORY_CARE_PROVIDER_SITE_OTHER): Payer: BLUE CROSS/BLUE SHIELD | Admitting: *Deleted

## 2018-12-13 ENCOUNTER — Other Ambulatory Visit: Payer: Self-pay

## 2018-12-13 DIAGNOSIS — E538 Deficiency of other specified B group vitamins: Secondary | ICD-10-CM

## 2018-12-13 DIAGNOSIS — Z1329 Encounter for screening for other suspected endocrine disorder: Secondary | ICD-10-CM | POA: Diagnosis not present

## 2018-12-13 DIAGNOSIS — R7989 Other specified abnormal findings of blood chemistry: Secondary | ICD-10-CM

## 2018-12-13 LAB — TSH: TSH: 0.19 u[IU]/mL — ABNORMAL LOW (ref 0.35–4.50)

## 2018-12-13 MED ORDER — CYANOCOBALAMIN 1000 MCG/ML IJ SOLN: 1000.0000 ug | Freq: Once | INTRAMUSCULAR | 0 refills | Status: AC

## 2018-12-13 MED ORDER — LEVOTHYROXINE SODIUM 75 MCG PO TABS: 75.0000 ug | ORAL_TABLET | Freq: Every day | ORAL | 3 refills | Status: DC

## 2018-12-13 MED ORDER — CYANOCOBALAMIN 1000 MCG/ML IJ SOLN
1000.0000 ug | Freq: Once | INTRAMUSCULAR | Status: AC
  Administered 2018-12-13: 1000 ug via INTRAMUSCULAR

## 2019-01-17 ENCOUNTER — Other Ambulatory Visit: Payer: Self-pay | Admitting: Family Medicine

## 2019-01-17 NOTE — Telephone Encounter (Signed)
Copied from CRM [phone]. Topic: Quick Communication - Rx Refill/Question >> Jan 17, 2019  8:59 AM Tamela OddiHarris, Naiomy Watters J wrote: Medication: zolpidem (AMBIEN) 10 MG tablet  Patient called to request a refill for the above medication  Preferred Pharmacy (with phone number or street name): [id number], [place] - [address] [phone] (Phone) [phone] (Fax)

## 2019-01-17 NOTE — Telephone Encounter (Signed)
Medication not delegated to NT to refill  

## 2019-01-20 ENCOUNTER — Telehealth: Payer: Self-pay | Admitting: Family Medicine

## 2019-01-20 NOTE — Telephone Encounter (Signed)
Requested has been routed to Dr Salomon FickBanks

## 2019-01-20 NOTE — Telephone Encounter (Signed)
Pt has called several times to check on refill request. It has been 3 days and she is needing this sent in asap.

## 2019-01-20 NOTE — Telephone Encounter (Unsigned)
Copied from CRM [id number]. Topic: Quick Communication - Rx Refill/Question >> Jan 17, 2019  8:59 AM Tamela Oddi wrote: Medication: zolpidem (AMBIEN) 10 MG tablet  Patient called to request a refill for the above medication  Preferred Pharmacy (with phone number or street name): [id number], [place] - [address] [phone] (Phone) [phone] (Fax) >> Jan 20, 2019 11:41 AM Marylen Ponto wrote: Pt called for an update on her refill request. Pt stated she is totally out of her medication and can not go another 2 days without it.

## 2019-01-20 NOTE — Telephone Encounter (Signed)
Please Advise

## 2019-01-22 NOTE — Telephone Encounter (Signed)
Though pt called a few days ago, her message took a few days to get to this provider.  Pt last seen 10/2018.  Pt was to f/u in 6 wks.  Given EtOH use, it is not advised that pt continue taking Ambien 10 mg due to possible s/e.

## 2019-01-23 NOTE — Telephone Encounter (Signed)
PEC is making patient aware that she needs an OV to follow you and to discuss medication refills.  Per notes below, patient was supposed to follow up end of Dec or first of January.   Will hold to follow up on appt

## 2019-01-25 ENCOUNTER — Encounter: Payer: Self-pay | Admitting: Family Medicine

## 2019-01-25 ENCOUNTER — Ambulatory Visit (INDEPENDENT_AMBULATORY_CARE_PROVIDER_SITE_OTHER): Payer: BLUE CROSS/BLUE SHIELD | Admitting: Family Medicine

## 2019-01-25 VITALS — BP 98/78 | HR 81 | Temp 97.7°F | Wt 129.0 lb

## 2019-01-25 DIAGNOSIS — E538 Deficiency of other specified B group vitamins: Secondary | ICD-10-CM

## 2019-01-25 DIAGNOSIS — G4700 Insomnia, unspecified: Secondary | ICD-10-CM

## 2019-01-25 DIAGNOSIS — F1010 Alcohol abuse, uncomplicated: Secondary | ICD-10-CM

## 2019-01-25 MED ORDER — ZOLPIDEM TARTRATE 5 MG PO TABS: 5.0000 mg | ORAL_TABLET | Freq: Every evening | ORAL | 0 refills | Status: DC | PRN

## 2019-01-25 MED ORDER — ZOLPIDEM TARTRATE 5 MG PO TABS: 5.0000 mg | ORAL_TABLET | Freq: Every evening | ORAL | 1 refills | Status: DC | PRN

## 2019-01-25 MED ORDER — CYANOCOBALAMIN 1000 MCG/ML IJ SOLN
1000.0000 ug | Freq: Once | INTRAMUSCULAR | Status: AC
  Administered 2019-01-25: 1000 ug via INTRAMUSCULAR

## 2019-01-25 NOTE — Progress Notes (Signed)
Subjective:    Patient ID: Laura Howell, female    DOB: [birth year], 58 y.o.   MRN: [id number]  No chief complaint on file.   HPI Patient was seen today for follow-up on medication and B12 injection.  Patient requesting a refill on Ambien 10 mg.  She has been out of this medication for a few days.  Pt states she does not take the med every night.  Pt was last seen 10/28/2018 for establish care visit.  At this visit pt was advised to follow-up in 1 month given concerns about increasing alcohol use.  Pt states she is drinking 2 glasses of wine or beer per day.  Pt did not follow-up with [facility].  States does not want to be recognized by anyone.  Pt has seen Dr. Milagros Evener in the past.  Past Medical History:  Diagnosis Date  . B12 deficiency   . Depression   . Thyroid disease     Allergies  Allergen Reactions  . Erythromycin Rash  . Tetracyclines & Related Rash    ROS General: Denies fever, chills, night sweats, changes in weight, changes in appetite HEENT: Denies headaches, ear pain, changes in vision, rhinorrhea, sore throat CV: Denies CP, palpitations, SOB, orthopnea Pulm: Denies SOB, cough, wheezing GI: Denies abdominal pain, nausea, vomiting, diarrhea, constipation GU: Denies dysuria, hematuria, frequency, vaginal discharge Msk: Denies muscle cramps, joint pains Neuro: Denies weakness, numbness, tingling Skin: Denies rashes, bruising Psych: Denies depression, anxiety, hallucinations  +Depression, EtOH use, insomnia     Objective:    Blood pressure 98/78, pulse 81, temperature 97.7 F (36.5 C), temperature source Oral, weight 129 lb (58.5 kg), SpO2 98 %.  Gen. Pleasant, well-nourished, in no distress, normal affect   HEENT: [place]/AT, face symmetric, no scleral icterus, PERRLA, nares patent without drainage  Lungs: no accessory muscle use, CTAB, no wheezes or rales Cardiovascular: RRR, no m/r/g, no peripheral edema Abdomen: BS present, soft, NT/ND. Neuro:  A&Ox3, CN II-XII intact,  normal gait.   Skin:  Warm, no lesions/ rash.  No spider telangiectasias   Wt Readings from Last 3 Encounters:  01/25/19 129 lb (58.5 kg)  10/28/18 126 lb 4 oz (57.3 kg)  02/16/18 135 lb (61.2 kg)    Lab Results  Component Value Date   WBC 6.0 10/28/2018   HGB 14.3 10/28/2018   HCT 42.6 10/28/2018   PLT 328.0 10/28/2018   GLUCOSE 84 10/28/2018   ALT 104 (H) 10/28/2018   AST 103 (H) 10/28/2018   NA 140 10/28/2018   K 4.5 10/28/2018   CL 104 10/28/2018   CREATININE 0.77 10/28/2018   BUN 10 10/28/2018   CO2 27 10/28/2018   TSH 0.19 (L) 12/13/2018    Assessment/Plan:  Insomnia, unspecified type  -Discussed concerns about alcohol abuse and medications that can depress respirations -We will decrease dose of Ambien to 5 mg from 10 mg. -Patient advised not to use medication every night. -Patient advised to follow-up with psychiatry for further refills on this and her other medications including Trintellix, trazodone, Wellbutrin.  Patient advised to call and make her own appointment with [facility] - Plan: zolpidem (AMBIEN) 5 MG tablet   ETOH abuse -Patient strongly encouraged to get help for alcohol use -Patient advised not to quit alcohol cold [place]. -Given handout-encouraged to make an appointment with [facility]  B12 deficiency  - Plan: cyanocobalamin ((VITAMIN B-12)) injection 1,000 mcg  Follow-up in 1 month, sooner if needed  Abbe Amsterdam, MD

## 2019-01-25 NOTE — Patient Instructions (Signed)
Insomnia Insomnia is a sleep disorder that makes it difficult to fall asleep or stay asleep. Insomnia can cause fatigue, low energy, difficulty concentrating, mood swings, and poor performance at work or school. There are three different ways to classify insomnia:  Difficulty falling asleep.  Difficulty staying asleep.  Waking up too early in the morning. Any type of insomnia can be long-term (chronic) or short-term (acute). Both are common. Short-term insomnia usually lasts for three months or less. Chronic insomnia occurs at least three times a week for longer than three months. What are the causes? Insomnia may be caused by another condition, situation, or substance, such as:  Anxiety.  Certain medicines.  Gastroesophageal reflux disease (GERD) or other gastrointestinal conditions.  Asthma or other breathing conditions.  Restless legs syndrome, sleep apnea, or other sleep disorders.  Chronic pain.  Menopause.  Stroke.  Abuse of alcohol, tobacco, or illegal drugs.  Mental health conditions, such as depression.  Caffeine.  Neurological disorders, such as Alzheimer's disease.  An overactive thyroid (hyperthyroidism). Sometimes, the cause of insomnia may not be known. What increases the risk? Risk factors for insomnia include:  Gender. Women are affected more often than men.  Age. Insomnia is more common as you get older.  Stress.  Lack of exercise.  Irregular work schedule or working night shifts.  Traveling between different time zones.  Certain medical and mental health conditions. What are the signs or symptoms? If you have insomnia, the main symptom is having trouble falling asleep or having trouble staying asleep. This may lead to other symptoms, such as:  Feeling fatigued or having low energy.  Feeling nervous about going to sleep.  Not feeling rested in the morning.  Having trouble concentrating.  Feeling irritable, anxious, or depressed. How  is this diagnosed? This condition may be diagnosed based on:  Your symptoms and medical history. Your health care provider may ask about: ? Your sleep habits. ? Any medical conditions you have. ? Your mental health.  A physical exam. How is this treated? Treatment for insomnia depends on the cause. Treatment may focus on treating an underlying condition that is causing insomnia. Treatment may also include:  Medicines to help you sleep.  Counseling or therapy.  Lifestyle adjustments to help you sleep better. Follow these instructions at home: Eating and drinking   Limit or avoid alcohol, caffeinated beverages, and cigarettes, especially close to bedtime. These can disrupt your sleep.  Do not eat a large meal or eat spicy foods right before bedtime. This can lead to digestive discomfort that can make it hard for you to sleep. Sleep habits   Keep a sleep diary to help you and your health care provider figure out what could be causing your insomnia. Write down: ? When you sleep. ? When you wake up during the night. ? How well you sleep. ? How rested you feel the next day. ? Any side effects of medicines you are taking. ? What you eat and drink.  Make your bedroom a dark, comfortable place where it is easy to fall asleep. ? Put up shades or blackout curtains to block light from outside. ? Use a white noise machine to block noise. ? Keep the temperature cool.  Limit screen use before bedtime. This includes: ? Watching TV. ? Using your smartphone, tablet, or computer.  Stick to a routine that includes going to bed and waking up at the same times every day and night. This can help you fall asleep faster. Consider   making a quiet activity, such as reading, part of your nighttime routine.  Try to avoid taking naps during the day so that you sleep better at night.  Get out of bed if you are still awake after 15 minutes of trying to sleep. Keep the lights down, but try reading or  doing a quiet activity. When you feel sleepy, go back to bed. General instructions  Take over-the-counter and prescription medicines only as told by your health care provider.  Exercise regularly, as told by your health care provider. Avoid exercise starting several hours before bedtime.  Use relaxation techniques to manage stress. Ask your health care provider to suggest some techniques that may work well for you. These may include: ? Breathing exercises. ? Routines to release muscle tension. ? Visualizing peaceful scenes.  Make sure that you drive carefully. Avoid driving if you feel very sleepy.  Keep all follow-up visits as told by your health care provider. This is important. Contact a health care provider if:  You are tired throughout the day.  You have trouble in your daily routine due to sleepiness.  You continue to have sleep problems, or your sleep problems get worse. Get help right away if:  You have serious thoughts about hurting yourself or someone else. If you ever feel like you may hurt yourself or others, or have thoughts about taking your own life, get help right away. You can go to your nearest emergency department or call:  Your local emergency services (911 in the [place]  A suicide crisis helpline, such as the National Suicide Prevention Lifeline at (608) 037-62331-786-105-7813. This is open 24 hours a day. Summary  Insomnia is a sleep disorder that makes it difficult to fall asleep or stay asleep.  Insomnia can be long-term (chronic) or short-term (acute).  Treatment for insomnia depends on the cause. Treatment may focus on treating an underlying condition that is causing insomnia.  Keep a sleep diary to help you and your health care provider figure out what could be causing your insomnia. This information is not intended to replace advice given to you by your health care provider. Make sure you discuss any questions you have with your health care provider. Document  Released: 12/11/2000 Document Revised: 09/23/2017 Document Reviewed: 09/23/2017 Elsevier Interactive Patient Education  2019 [employer]  Alcohol Use Disorder Alcohol use disorder is when your drinking disrupts your daily life. When you have this condition, you drink too much alcohol and you cannot control your drinking. Alcohol use disorder can cause serious problems with your physical health. It can affect your brain, heart, liver, pancreas, immune system, stomach, and intestines. Alcohol use disorder can increase your risk for certain cancers and cause problems with your mental health, such as depression, anxiety, psychosis, delirium, and dementia. People with this disorder risk hurting themselves and others. What are the causes? This condition is caused by drinking too much alcohol over time. It is not caused by drinking too much alcohol only one or two times. Some people with this condition drink alcohol to cope with or escape from negative life events. Others drink to relieve pain or symptoms of mental illness. What increases the risk? You are more likely to develop this condition if:  You have a family history of alcohol use disorder.  Your culture encourages drinking to the point of intoxication, or makes alcohol easy to get.  You had a mood or conduct disorder in childhood.  You have been a victim of abuse.  You are  an adolescent and: ? You have poor grades or difficulties in school. ? Your caregivers do not talk to you about saying no to alcohol, or supervise your activities. ? You are impulsive or you have trouble with self-control. What are the signs or symptoms? Symptoms of this condition include:  Drinkingmore than you want to.  Drinking for longer than you want to.  Trying several times to drink less or to control your drinking.  Spending a lot of time getting alcohol, drinking, or recovering from drinking.  Craving alcohol.  Having problems at work, at school,  or at home due to drinking.  Having problems in relationships due to drinking.  Drinking when it is dangerous to drink, such as before driving a car.  Continuing to drink even though you know you might have a physical or mental problem related to drinking.  Needing more and more alcohol to get the same effect you want from the alcohol (building up tolerance).  Having symptoms of withdrawal when you stop drinking. Symptoms of withdrawal include: ? Fatigue. ? Nightmares. ? Trouble sleeping. ? Depression. ? Anxiety. ? Fever. ? Seizures. ? Severe confusion. ? Feeling or seeing things that are not there (hallucinations). ? Tremors. ? Rapid heart rate. ? Rapid breathing. ? High blood pressure.  Drinking to avoid symptoms of withdrawal. How is this diagnosed? This condition is diagnosed with an assessment. Your health care provider may start the assessment by asking three or four questions about your drinking. Your health care provider may perform a physical exam or do lab tests to see if you have physical problems resulting from alcohol use. She or he may refer you to a mental health professional for evaluation. How is this treated? Some people with alcohol use disorder are able to reduce their alcohol use to low-risk levels. Others need to completely quit drinking alcohol. When necessary, mental health professionals with specialized training in substance use treatment can help. Your health care provider can help you decide how severe your alcohol use disorder is and what type of treatment you need. The following forms of treatment are available:  Detoxification. Detoxification involves quitting drinking and using prescription medicines within the first week to help lessen withdrawal symptoms. This treatment is important for people who have had withdrawal symptoms before and for heavy drinkers who are likely to have withdrawal symptoms. Alcohol withdrawal can be dangerous, and in severe  cases, it can cause death. Detoxification may be provided in a home, community, or primary care setting, or in a hospital or substance use treatment facility.  Counseling. This treatment is also called talk therapy. It is provided by substance use treatment counselors. A counselor can address the reasons you use alcohol and suggest ways to keep you from drinking again or to prevent problem drinking. The goals of talk therapy are to: ? Find healthy activities and ways for you to cope with stress. ? Identify and avoid the things that trigger your alcohol use. ? Help you learn how to handle cravings.  Medicines.Medicines can help treat alcohol use disorder by: ? Decreasing alcohol cravings. ? Decreasing the positive feeling you have when you drink alcohol. ? Causing an uncomfortable physical reaction when you drink alcohol (aversion therapy).  Support groups. Support groups are led by people who have quit drinking. They provide emotional support, advice, and guidance. These forms of treatment are often combined. Some people with this condition benefit from a combination of treatments provided by specialized substance use treatment centers. Follow these  instructions at home:  Take over-the-counter and prescription medicines only as told by your health care provider.  Check with your health care provider before starting any new medicines.  Ask friends and family members not to offer you alcohol.  Avoid situations where alcohol is served, including gatherings where others are drinking alcohol.  Create a plan for what to do when you are tempted to use alcohol.  Find hobbies or activities that you enjoy that do not include alcohol.  Keep all follow-up visits as told by your health care provider. This is important. How is this prevented?  If you drink, limit alcohol intake to no more than 1 drink a day for nonpregnant women and 2 drinks a day for men. One drink equals 12 oz of beer, 5 oz of  wine, or 1 oz of hard liquor.  If you have a mental health condition, get treatment and support.  Do not give alcohol to adolescents.  If you are an adolescent: ? Do not drink alcohol. ? Do not be afraid to say no if someone offers you alcohol. Speak up about why you do not want to drink. You can be a positive role model for your friends and set a good example for those around you by not drinking alcohol. ? If your friends drink, spend time with others who do not drink alcohol. Make new friends who do not use alcohol. ? Find healthy ways to manage stress and emotions, such as meditation or deep breathing, exercise, spending time in nature, listening to music, or talking with a trusted friend or family member. Contact a health care provider if:  You are not able to take your medicines as told.  Your symptoms get worse.  You return to drinking alcohol (relapse) and your symptoms get worse. Get help right away if:  You have thoughts about hurting yourself or others. If you ever feel like you may hurt yourself or others, or have thoughts about taking your own life, get help right away. You can go to your nearest emergency department or call:  Your local emergency services (911 in the [place]  A suicide crisis helpline, such as the National Suicide Prevention Lifeline at 3477793242. This is open 24 hours a day. Summary  Alcohol use disorder is when your drinking disrupts your daily life. When you have this condition, you drink too much alcohol and you cannot control your drinking.  Treatment may include detoxification, counseling, medicine, and support groups.  Ask friends and family members not to offer you alcohol. Avoid situations where alcohol is served.  Get help right away if you have thoughts about hurting yourself or others. This information is not intended to replace advice given to you by your health care provider. Make sure you discuss any questions you have with your  health care provider. Document Released: 01/21/2005 Document Revised: 09/10/2016 Document Reviewed: 09/10/2016 Elsevier Interactive Patient Education  2019 [employer].  Alcohol Abuse and Nutrition Alcohol abuse is any pattern of alcohol consumption that harms your health, relationships, or work. Alcohol abuse can cause poor nutrition (malnutrition or malnourishment) and a lack of nutrients (nutrient deficiencies), which can lead to more complications. Alcohol abuse brings malnutrition and nutrient deficiencies in two ways:  It causes your liver to work abnormally. This affects how your body divides (breaks down) and absorbs nutrients from food.  It causes you to eat poorly. Many people who abuse alcohol do not eat enough carbohydrates, protein, fat, vitamins, and minerals. Nutrients that are  commonly lacking (deficient) in people who abuse alcohol include:  Vitamins. ? Vitamin A. This is needed for your vision, metabolism, and ability to fight off infections (immunity). ? B vitamins. These include folate, thiamine, and niacin. These are needed for new cell growth. ? Vitamin C. This plays an important role in wound healing, immunity, and helping your body to absorb iron. ? Vitamin D. This is necessary for your body to absorb and use calcium. It is produced by your liver, but you can also get it from food and from sun exposure.  Minerals. ? Calcium. This is needed for healthy bones as well as heart and blood vessel (cardiovascular) function. ? Iron. This is important for blood, muscle, and nervous system functioning. ? Magnesium. This plays an important role in muscle and nerve function, and it helps to control blood sugar and blood pressure. ? Zinc. This is important for the normal functioning of your nervous system and digestive system (gastrointestinal tract). If you think that you have an alcohol dependency problem, or if it is hard to stop drinking because you feel sick or different when  you do not use alcohol, talk with your health care provider or another health professional about where to get help. Nutrition is an essential factor in therapy for alcohol abuse. Your health care provider or diet and nutrition specialist (dietitian) will work with you to design a plan that can help to restore nutrients to your body and prevent the risk of complications. What is my plan? Your dietitian may develop a specific eating plan that is based on your condition and any other problems that you have. An eating plan will commonly include:  A balanced diet. ? Grains: 6-8 oz (170-227 g) a day. Examples of 1 oz of whole grains include 1 cup of whole-wheat cereal,  cup of brown rice, or 1 slice of whole-wheat bread. ? Vegetables: 2-3 cups a day. Examples of 1 cup of vegetables include 2 medium carrots, 1 large tomato, or 2 stalks of celery. ? Fruits: 1-2 cups a day. Examples of 1 cup of fruit include 1 large banana, 1 small apple, 8 large strawberries, or 1 large orange. ? Meat and other protein: 5-6 oz (142-170 g) a day.  A cut of meat or fish that is the size of a deck of cards is about 3-4 oz.  Foods that provide 1 oz of protein include 1 egg,  cup of nuts or seeds, or 1 tablespoon (16 g) of peanut butter. ? Dairy: 2-3 cups a day. Examples of 1 cup of dairy include 8 oz (230 mL) of milk, 8 oz (230 g) of yogurt, or 1 oz (44 g) of natural cheese.  Vitamin and mineral supplements. What are tips for following this plan?  Eat frequent meals and snacks. Try to eat 5-6 small meals each day.  Take vitamin or mineral supplements as recommended by your dietitian.  If you are malnourished or if your dietitian recommends it: ? You may follow a high-protein, high-calorie diet. This may include:  2,000-3,000 calories (kilocalories) a day.  70-100 g (grams) of protein a day. ? You may be directed to follow a diet that includes a complete nutritional supplement beverage. This can help to restore  calories, protein, and vitamins to your body. Depending on your condition, you may be advised to consume this beverage instead of your meals or in addition to them.  Certain medicines may cause changes in your appetite, taste, and weight. Work with your health  care provider and dietitian to make any changes to your medicines and eating plan.  If you are unable to take in enough food and calories by mouth, your health care provider may recommend a feeding tube. This tube delivers nutritional supplements directly to your stomach. Recommended foods  Eat foods that are high in molecules that prevent oxygen from reacting with your food (antioxidants). These foods include grapes, berries, nuts, green tea, and dark green or orange vegetables. Eating these can help to prevent some of the stress that is placed on your liver by consuming alcohol.  Eat a variety of fresh fruits and vegetables each day. This will help you to get fiber and vitamins in your diet.  Drink plenty of water and other clear fluids, such as apple juice and broth. Try to drink at least 48-64 oz (1.5-2 L) of water a day.  Include foods fortified with vitamins and minerals in your diet. Commonly fortified foods include milk, orange juice, cereal, and bread.  Eat a variety of foods that are high in omega-3 and omega-6 fatty acids. These include fish, nuts and seeds, and soybeans. These foods may help your liver to recover and may also stabilize your mood.  If you are a vegetarian: ? Eat a variety of protein-rich foods. ? Pair whole grains with plant-based proteins at meals and snack time. For example, eat rice with beans, put peanut butter on whole-grain toast, or eat oatmeal with sunflower seeds. The items listed above may not be a complete list of foods and beverages you can eat. Contact a dietitian for more information. Foods to avoid  Avoid foods and drinks that are high in fat and sugar. Sugary drinks, salty snacks, and candy  contain empty calories. This means that they lack important nutrients such as protein, fiber, and vitamins.  Avoid alcohol. This is the best way to avoid malnutrition due to alcohol abuse. If you must drink, drink measured amounts. Measured drinking means limiting your intake to no more than 1 drink a day for nonpregnant women and 2 drinks a day for men. One drink equals 12 oz (355 mL) of beer, 5 oz (148 mL) of wine, or 1 oz (44 mL) of hard liquor.  Limit your intake of caffeine. Replace drinks like coffee and black tea with decaffeinated coffee and decaffeinated herbal tea. The items listed above may not be a complete list of foods and beverages you should avoid. Contact a dietitian for more information. Summary  Alcohol abuse can cause poor nutrition (malnutrition or malnourishment) and a lack of nutrients (nutrient deficiencies), which can lead to more health problems.  Common nutrient deficiencies include vitamin deficiencies (A, B, C, and D) and mineral deficiencies (calcium, iron, magnesium, and zinc).  Nutrition is an essential factor in therapy for alcohol abuse.  Your health care provider and dietitian can help you to develop a specific eating plan that includes a balanced diet plus vitamin and mineral supplements. This information is not intended to replace advice given to you by your health care provider. Make sure you discuss any questions you have with your health care provider. Document Released: 10/08/2005 Document Revised: 08/17/2018 Document Reviewed: 08/31/2017 Elsevier Interactive Patient Education  2019 [employer].

## 2019-01-26 NOTE — Telephone Encounter (Signed)
Pt was seen yesterday 01/25/2019 at our office and Rx was refilled

## 2019-01-27 NOTE — Telephone Encounter (Signed)
FYI. Noted

## 2019-03-10 ENCOUNTER — Other Ambulatory Visit: Payer: Self-pay | Admitting: Family Medicine

## 2019-03-22 ENCOUNTER — Telehealth: Payer: Self-pay

## 2019-03-22 NOTE — Telephone Encounter (Signed)
Copied from CRM [id number]. Topic: General - Other >> Mar 22, 2019  3:35 PM Reggie Pile, NT wrote: Reason for CRM:  Patient's husband, Scott< left a VM stating his wife needs a new prescription for zolpidem at a higher dosage due to 5mg  not helping her. States wife cannot come in for an appointment due to her starting a new job. Would like a call back further discussing plan of the new prescription and if it has been sent. Best number to be reached at is [phone].

## 2019-03-23 NOTE — Telephone Encounter (Signed)
Please Advise

## 2019-03-23 NOTE — Telephone Encounter (Signed)
Pt was given a limited refill at visit in January.  Dose was decreased given concern for respiratory depression given concominant use of EtOH.  Pt was advised she would need to est with psychiatry for further management of this and several other medications.  Given issues with COVID 19 willing to give a one month supply of Ambien 5 mg with zero refills until pt establishes with [facility].

## 2019-03-24 ENCOUNTER — Telehealth: Payer: Self-pay | Admitting: Family Medicine

## 2019-03-24 DIAGNOSIS — G4700 Insomnia, unspecified: Secondary | ICD-10-CM

## 2019-03-24 NOTE — Telephone Encounter (Signed)
Spoke with pt verbalized understanding of Dr Salomon Fick advise, Pt advised to call the psychiatry for establish care so they can manage  Pt medications.

## 2019-03-29 ENCOUNTER — Ambulatory Visit: Payer: Self-pay | Admitting: Family Medicine

## 2019-03-29 NOTE — Telephone Encounter (Signed)
I want to make sure I don't have the virus.   I feel like I have a severe cold.    No fever.   Runny nose, headache.  Coughing dry.  No body aches maybe some chills.  Slight sore throat.  No exposure.    My husband this last weekend had this.   He is better now.    I'm using a histamine.  It is helping.  She is requesting her Ambien be refilled.   Dr. Salomon Fick told her to get this done through Dr. Lafayette Dragon ?(spelling) however Dr. Lafayette Dragon can't see her until July.   It's been 13 years since Dr. Lafayette Dragon saw her so she's not going to prescribe the Ambien.   Would Dr. Salomon Fick being willing to prescribe it until I see Dr. Lafayette Dragon in July?  These notes sent to Dr. Salomon Fick for further dispositon.    Reason for Disposition . Cold with no complications  Answer Assessment - Initial Assessment Questions 1. ONSET: "When did the nasal discharge start?"      I feel  Like I have a severe cold.   I just want to be sure I don't have the coronavirus.  No fever.   I have a runny nose, headache, and a dry cough.  Slight sore throat. 2. AMOUNT: "How much discharge is there?"      My nose is runny. 3. COUGH: "Do you have a cough?" If yes, ask: "Describe the color of your sputum" (clear, white, yellow, green)     Yes a dry cough 4. RESPIRATORY DISTRESS: "Describe your breathing."      No shortness of breath 5. FEVER: "Do you have a fever?" If so, ask: "What is your temperature, how was it measured, and when did it start?"     No fever. 6. SEVERITY: "Overall, how bad are you feeling right now?" (e.g., doesn't interfere with normal activities, staying home from school/work, staying in bed)      I don't have body aches maybe mild chills at times. 7. OTHER SYMPTOMS: "Do you have any other symptoms?" (e.g., sore throat, earache, wheezing, vomiting)      See above. 8. PREGNANCY: "Is there any chance you are pregnant?" "When was your last menstrual period?"     Not asked  Protocols used: COMMON COLD-A-AH

## 2019-03-31 ENCOUNTER — Other Ambulatory Visit: Payer: Self-pay | Admitting: Family Medicine

## 2019-03-31 DIAGNOSIS — G4700 Insomnia, unspecified: Secondary | ICD-10-CM

## 2019-03-31 MED ORDER — ZOLPIDEM TARTRATE 5 MG PO TABS: 5.0000 mg | ORAL_TABLET | Freq: Every evening | ORAL | 0 refills | Status: DC | PRN

## 2019-03-31 NOTE — Telephone Encounter (Signed)
Pt is aware that rx refill was sent to her pharmacy

## 2019-03-31 NOTE — Telephone Encounter (Signed)
A limited 1 month refill of Ambien was sent to pt's pharmacy.

## 2019-05-10 ENCOUNTER — Telehealth: Payer: Self-pay | Admitting: Family Medicine

## 2019-05-10 NOTE — Telephone Encounter (Signed)
Pt LOV was on 01/25/2019 and last refill was 03/31/2019 for 30 tab, please advise

## 2019-05-10 NOTE — Telephone Encounter (Signed)
Pt states that she called the [facility] to schedule an appointment, pt states that they have no openings current until further notice due to COVID-19. Provided pt with additional psychiatric resources to try call and see if they would schedule her appointment. Pt requests for a refill while she waits for an appointment. Please advise

## 2019-05-10 NOTE — Telephone Encounter (Signed)
Copied from CRM [id number]. Topic: Quick Communication - Rx Refill/Question >> May 10, 2019  8:28 AM Gwenlyn Fudge A wrote: Medication: zolpidem (AMBIEN) 5 MG tablet  Has the patient contacted their pharmacy? No. (Agent: If no, request that the patient contact the pharmacy for the refill.) (Agent: If yes, when and what did the pharmacy advise?)  Preferred Pharmacy (with phone number or street name): [id number] [place], [place] - [address] [place] [address] Phone: [phone] Fax: [phone] Not a 24 hour pharmacy; exact hours not known.   Agent: Please be advised that RX refills may take up to 3 business days. We ask that you follow-up with your pharmacy.

## 2019-05-10 NOTE — Telephone Encounter (Signed)
A limited one month supply of Ambien was given to pt in April.  Pt was advised to f/u with [facility] for further refills.

## 2019-05-11 NOTE — Telephone Encounter (Signed)
Unfortunately pt was advised in January to take care of this.  She was given limited refills x 2 already.

## 2019-05-16 NOTE — Telephone Encounter (Signed)
Spoke with pt verbalized understanding of Dr Banks advise 

## 2019-05-18 NOTE — Telephone Encounter (Signed)
Pt called again regarding sleep medication. She states she made an appt with [facility]. She is requesting to have a sleep medication to hold her over until that appt because the over the counter is not helping. Please advise.

## 2019-06-02 ENCOUNTER — Other Ambulatory Visit: Payer: Self-pay | Admitting: Family Medicine

## 2019-07-18 ENCOUNTER — Other Ambulatory Visit: Payer: Self-pay | Admitting: Family Medicine

## 2019-12-14 ENCOUNTER — Other Ambulatory Visit: Payer: Self-pay | Admitting: Family Medicine

## 2019-12-15 MED ORDER — LEVOTHYROXINE SODIUM 75 MCG PO TABS: 75.0000 ug | ORAL_TABLET | Freq: Every day | ORAL | 0 refills | Status: DC

## 2019-12-15 NOTE — Telephone Encounter (Signed)
Pt needs appointment for further refills 

## 2020-01-06 ENCOUNTER — Other Ambulatory Visit: Payer: Self-pay | Admitting: Family Medicine

## 2020-01-07 ENCOUNTER — Other Ambulatory Visit: Payer: Self-pay | Admitting: Family Medicine

## 2020-01-08 NOTE — Telephone Encounter (Signed)
Left a message for pt to call the office and schedule a CPE/Virtual visit for her TSH/Levothyroxine medication refill

## 2020-01-10 NOTE — Telephone Encounter (Signed)
Left a voicemail for pt o call the office and schedule a virtual visit for her med refill

## 2020-01-11 NOTE — Telephone Encounter (Signed)
Pt needs appointment for further refills 

## 2020-01-12 ENCOUNTER — Other Ambulatory Visit: Payer: Self-pay

## 2020-01-15 ENCOUNTER — Other Ambulatory Visit: Payer: Self-pay

## 2020-01-15 ENCOUNTER — Ambulatory Visit: Payer: 59 | Admitting: Women's Health

## 2020-01-15 ENCOUNTER — Encounter: Payer: Self-pay | Admitting: Women's Health

## 2020-01-15 VITALS — BP 118/78 | Ht 62.0 in | Wt 118.0 lb

## 2020-01-15 DIAGNOSIS — Z01419 Encounter for gynecological examination (general) (routine) without abnormal findings: Secondary | ICD-10-CM

## 2020-01-15 DIAGNOSIS — E038 Other specified hypothyroidism: Secondary | ICD-10-CM

## 2020-01-15 DIAGNOSIS — Z13820 Encounter for screening for osteoporosis: Secondary | ICD-10-CM | POA: Diagnosis not present

## 2020-01-15 MED ORDER — LEVOTHYROXINE SODIUM 75 MCG PO TABS: 75.0000 ug | ORAL_TABLET | Freq: Every day | ORAL | 4 refills | Status: DC

## 2020-01-15 NOTE — Addendum Note (Signed)
Addended by: Tito Dine on: 01/15/2020 09:26 AM   Modules accepted: Orders

## 2020-01-15 NOTE — Patient Instructions (Addendum)
[facility]  Shingrex 2 series  Shingles vaccine Vit D 2000 iu daily [facility]  [phone]  Health Maintenance for Postmenopausal Women Menopause is a normal process in which your ability to get pregnant comes to an end. This process happens slowly over many months or years, usually between the ages of 58 and 18. Menopause is complete when you have missed your menstrual periods for 12 months. It is important to talk with your health care provider about some of the most common conditions that affect women after menopause (postmenopausal women). These include heart disease, cancer, and bone loss (osteoporosis). Adopting a healthy lifestyle and getting preventive care can help to promote your health and wellness. The actions you take can also lower your chances of developing some of these common conditions. What should I know about menopause? During menopause, you may get a number of symptoms, such as:  Hot flashes. These can be moderate or severe.  Night sweats.  Decrease in sex drive.  Mood swings.  Headaches.  Tiredness.  Irritability.  Memory problems.  Insomnia. Choosing to treat or not to treat these symptoms is a decision that you make with your health care provider. Do I need hormone replacement therapy?  Hormone replacement therapy is effective in treating symptoms that are caused by menopause, such as hot flashes and night sweats.  Hormone replacement carries certain risks, especially as you become older. If you are thinking about using estrogen or estrogen with progestin, discuss the benefits and risks with your health care provider. What is my risk for heart disease and stroke? The risk of heart disease, heart attack, and stroke increases as you age. One of the causes may be a change in the body's hormones during menopause. This can affect how your body uses dietary fats, triglycerides, and cholesterol. Heart attack and stroke are medical emergencies.  There are many things that you can do to help prevent heart disease and stroke. Watch your blood pressure  High blood pressure causes heart disease and increases the risk of stroke. This is more likely to develop in people who have high blood pressure readings, are of African descent, or are overweight.  Have your blood pressure checked: ? Every 3-5 years if you are 58-69 years of age. ? Every year if you are 58 years old or older. Eat a healthy diet   Eat a diet that includes plenty of vegetables, fruits, low-fat dairy products, and lean protein.  Do not eat a lot of foods that are high in solid fats, added sugars, or sodium. Get regular exercise Get regular exercise. This is one of the most important things you can do for your health. Most adults should:  Try to exercise for at least 150 minutes each week. The exercise should increase your heart rate and make you sweat (moderate-intensity exercise).  Try to do strengthening exercises at least twice each week. Do these in addition to the moderate-intensity exercise.  Spend less time sitting. Even light physical activity can be beneficial. Other tips  Work with your health care provider to achieve or maintain a healthy weight.  Do not use any products that contain nicotine or tobacco, such as cigarettes, e-cigarettes, and chewing tobacco. If you need help quitting, ask your health care provider.  Know your numbers. Ask your health care provider to check your cholesterol and your blood sugar (glucose). Continue to have your blood tested as directed by your health care provider. Do I need screening for cancer? Depending  on your health history and family history, you may need to have cancer screening at different stages of your life. This may include screening for:  Breast cancer.  Cervical cancer.  Lung cancer.  Colorectal cancer. What is my risk for osteoporosis? After menopause, you may be at increased risk for osteoporosis.  Osteoporosis is a condition in which bone destruction happens more quickly than new bone creation. To help prevent osteoporosis or the bone fractures that can happen because of osteoporosis, you may take the following actions:  If you are 24-109 years old, get at least 1,000 mg of calcium and at least 600 mg of vitamin D per day.  If you are older than age 58 but younger than age 84, get at least 1,200 mg of calcium and at least 600 mg of vitamin D per day.  If you are older than age 58, get at least 1,200 mg of calcium and at least 800 mg of vitamin D per day. Smoking and drinking excessive alcohol increase the risk of osteoporosis. Eat foods that are rich in calcium and vitamin D, and do weight-bearing exercises several times each week as directed by your health care provider. How does menopause affect my mental health? Depression may occur at any age, but it is more common as you become older. Common symptoms of depression include:  Low or sad mood.  Changes in sleep patterns.  Changes in appetite or eating patterns.  Feeling an overall lack of motivation or enjoyment of activities that you previously enjoyed.  Frequent crying spells. Talk with your health care provider if you think that you are experiencing depression. General instructions See your health care provider for regular wellness exams and vaccines. This may include:  Scheduling regular health, dental, and eye exams.  Getting and maintaining your vaccines. These include: ? Influenza vaccine. Get this vaccine each year before the flu season begins. ? Pneumonia vaccine. ? Shingles vaccine. ? Tetanus, diphtheria, and pertussis (Tdap) booster vaccine. Your health care provider may also recommend other immunizations. Tell your health care provider if you have ever been abused or do not feel safe at home. Summary  Menopause is a normal process in which your ability to get pregnant comes to an end.  This condition causes  hot flashes, night sweats, decreased interest in sex, mood swings, headaches, or lack of sleep.  Treatment for this condition may include hormone replacement therapy.  Take actions to keep yourself healthy, including exercising regularly, eating a healthy diet, watching your weight, and checking your blood pressure and blood sugar levels.  Get screened for cancer and depression. Make sure that you are up to date with all your vaccines. This information is not intended to replace advice given to you by your health care provider. Make sure you discuss any questions you have with your health care provider. Document Revised: 12/07/2018 Document Reviewed: 12/07/2018 Elsevier Patient Education  [employer].  [phone]

## 2020-01-15 NOTE — Progress Notes (Signed)
Laura Howell 1961/08/07 [id number]    History:    Presents for annual exam.  Previous patient who moved to [place] and has recently moved back.  Reports normal Pap and mammogram history and normal lipid panel.  Exercises 7 days weekly.  Postmenopausal 10 years with no bleeding on no HRT.  Has not had a screening colonoscopy.  Hypothyroid and depression-psychiatrist manages.  Past medical history, past surgical history, family history and social history were all reviewed and documented in the EPIC chart.  Desk job.    Healthy lifestyle.  ROS:  A ROS was performed and pertinent positives and negatives are included.  Exam:  Vitals:   01/15/20 0836  BP: 118/78  Weight: 118 lb (53.5 kg)  Height: 5\' 2"  (1.575 m)   Body mass index is 21.58 kg/m.   General appearance:  Normal Thyroid:  Symmetrical, normal in size, without palpable masses or nodularity. Respiratory  Auscultation:  Clear without wheezing or rhonchi Cardiovascular  Auscultation:  Regular rate, without rubs, murmurs or gallops  Edema/varicosities:  Not grossly evident Abdominal  Soft,nontender, without masses, guarding or rebound.  Liver/spleen:  No organomegaly noted  Hernia:  None appreciated  Skin  Inspection:  Grossly normal   Breasts: Examined lying and sitting.     Right: Without masses, retractions, discharge or axillary adenopathy.     Left: Without masses, retractions, discharge or axillary adenopathy. Gentitourinary   Inguinal/mons:  Normal without inguinal adenopathy  External genitalia:  Normal  BUS/Urethra/Skene's glands:  Normal  Vagina: Atrophic  Cervix:  Normal  Uterus:   normal in size, shape and contour.  Midline and mobile  Adnexa/parametria:     Rt: Without masses or tenderness.   Lt: Without masses or tenderness.  Anus and perineum: Normal  Digital rectal exam: Normal sphincter tone without palpated masses or tenderness  Assessment/Plan:  59 y.o. MWF G0 for annual exam with no complaints other  than occasional constipation.  Postmenopausal/no HRT/no bleeding with vaginal atrophy asymptomatic Hypothyroid on Synthroid 75 mcg Depression on Wellbutrin psychiatrist manages  Plan: SBEs, annual screening mammogram, breast center information given instructed to schedule.  Screening colonoscopy [facility] information given instructed to schedule.  Continue healthy lifestyle of healthy diet, regular cardio type exercise, rides a stationary bike most days of the week.  Aware of importance of weightbearing and balance type exercise.  Vaginal lubricants with intercourse encouraged, vaginal atrophy.  Calcium rich foods, vitamin D 2000 IUs daily.  Schedule DEXA.  CBC, CMP, TSH, Pap with HR HPV typing, new screening guidelines reviewed.    [address] [facility], 9:15 AM 01/15/2020

## 2020-01-16 LAB — CBC WITH DIFFERENTIAL/PLATELET
Absolute Monocytes: 581 cells/uL (ref 200–950)
Basophils Absolute: 70 cells/uL (ref 0–200)
Basophils Relative: 1.6 %
Eosinophils Absolute: 62 cells/uL (ref 15–500)
Eosinophils Relative: 1.4 %
HCT: 40.1 % (ref 35.0–45.0)
Hemoglobin: 13.8 g/dL (ref 11.7–15.5)
Lymphs Abs: 1135 cells/uL (ref 850–3900)
MCH: 35.5 pg — ABNORMAL HIGH (ref 27.0–33.0)
MCHC: 34.4 g/dL (ref 32.0–36.0)
MCV: 103.1 fL — ABNORMAL HIGH (ref 80.0–100.0)
MPV: 10.4 fL (ref 7.5–12.5)
Monocytes Relative: 13.2 %
Neutro Abs: 2552 cells/uL (ref 1500–7800)
Neutrophils Relative %: 58 %
Platelets: 260 10*3/uL (ref 140–400)
RBC: 3.89 10*6/uL (ref 3.80–5.10)
RDW: 11.2 % (ref 11.0–15.0)
Total Lymphocyte: 25.8 %
WBC: 4.4 10*3/uL (ref 3.8–10.8)

## 2020-01-16 LAB — COMPREHENSIVE METABOLIC PANEL
AG Ratio: 1.6 (calc) (ref 1.0–2.5)
ALT: 71 U/L — ABNORMAL HIGH (ref 6–29)
AST: 92 U/L — ABNORMAL HIGH (ref 10–35)
Albumin: 4 g/dL (ref 3.6–5.1)
Alkaline phosphatase (APISO): 78 U/L (ref 37–153)
BUN: 12 mg/dL (ref 7–25)
CO2: 27 mmol/L (ref 20–32)
Calcium: 9.6 mg/dL (ref 8.6–10.4)
Chloride: 105 mmol/L (ref 98–110)
Creat: 0.77 mg/dL (ref 0.50–1.05)
Globulin: 2.5 g/dL (calc) (ref 1.9–3.7)
Glucose, Bld: 85 mg/dL (ref 65–99)
Potassium: 4.3 mmol/L (ref 3.5–5.3)
Sodium: 141 mmol/L (ref 135–146)
Total Bilirubin: 1 mg/dL (ref 0.2–1.2)
Total Protein: 6.5 g/dL (ref 6.1–8.1)

## 2020-01-16 LAB — TSH: TSH: 1.94 mIU/L (ref 0.40–4.50)

## 2020-01-17 LAB — PAP, TP IMAGING W/ HPV RNA, RFLX HPV TYPE 16,18/45: HPV DNA High Risk: NOT DETECTED

## 2020-02-07 ENCOUNTER — Ambulatory Visit: Payer: Self-pay | Admitting: Women's Health

## 2020-02-08 ENCOUNTER — Ambulatory Visit (INDEPENDENT_AMBULATORY_CARE_PROVIDER_SITE_OTHER): Payer: 59

## 2020-02-08 ENCOUNTER — Other Ambulatory Visit: Payer: Self-pay

## 2020-02-08 DIAGNOSIS — Z780 Asymptomatic menopausal state: Secondary | ICD-10-CM

## 2020-02-08 DIAGNOSIS — Z13820 Encounter for screening for osteoporosis: Secondary | ICD-10-CM

## 2020-02-08 DIAGNOSIS — M8589 Other specified disorders of bone density and structure, multiple sites: Secondary | ICD-10-CM | POA: Diagnosis not present

## 2020-02-09 ENCOUNTER — Other Ambulatory Visit: Payer: Self-pay | Admitting: Obstetrics & Gynecology

## 2020-02-09 DIAGNOSIS — Z13820 Encounter for screening for osteoporosis: Secondary | ICD-10-CM

## 2020-02-09 DIAGNOSIS — M8589 Other specified disorders of bone density and structure, multiple sites: Secondary | ICD-10-CM

## 2020-02-16 NOTE — Telephone Encounter (Signed)
error 

## 2020-03-03 ENCOUNTER — Ambulatory Visit: Payer: Self-pay | Attending: Internal Medicine

## 2020-03-03 DIAGNOSIS — Z23 Encounter for immunization: Secondary | ICD-10-CM | POA: Insufficient documentation

## 2020-03-03 NOTE — Progress Notes (Signed)
   Covid-19 Vaccination Clinic  Name:  Laura Howell    MRN: [id number] DOB: Nov 24, 1961  03/03/2020  [name] was observed post Covid-19 immunization for 15 minutes without incident. She was provided with Vaccine Information Sheet and instruction to access the V-Safe system.   [name] was instructed to call 911 with any severe reactions post vaccine: Marland Kitchen Difficulty breathing  . Swelling of face and throat  . A fast heartbeat  . A bad rash all over body  . Dizziness and weakness   Immunizations Administered    Name Date Dose VIS Date Route   Pfizer COVID-19 Vaccine 03/03/2020  1:56 PM 0.3 mL 12/08/2019 Intramuscular   Manufacturer: [employer], [employer]   Lot: [id number]   NDC: [address]-4

## 2020-04-03 ENCOUNTER — Ambulatory Visit: Payer: Self-pay

## 2020-04-15 ENCOUNTER — Encounter: Payer: Self-pay | Admitting: Family Medicine

## 2020-04-15 ENCOUNTER — Telehealth (INDEPENDENT_AMBULATORY_CARE_PROVIDER_SITE_OTHER): Payer: Self-pay | Admitting: Family Medicine

## 2020-04-15 ENCOUNTER — Telehealth: Payer: Self-pay

## 2020-04-15 VITALS — Wt 122.0 lb

## 2020-04-15 DIAGNOSIS — F339 Major depressive disorder, recurrent, unspecified: Secondary | ICD-10-CM

## 2020-04-15 DIAGNOSIS — F1090 Alcohol use, unspecified, uncomplicated: Secondary | ICD-10-CM | POA: Insufficient documentation

## 2020-04-15 DIAGNOSIS — E039 Hypothyroidism, unspecified: Secondary | ICD-10-CM

## 2020-04-15 DIAGNOSIS — Z7289 Other problems related to lifestyle: Secondary | ICD-10-CM

## 2020-04-15 DIAGNOSIS — L0100 Impetigo, unspecified: Secondary | ICD-10-CM

## 2020-04-15 DIAGNOSIS — R7989 Other specified abnormal findings of blood chemistry: Secondary | ICD-10-CM

## 2020-04-15 DIAGNOSIS — Z789 Other specified health status: Secondary | ICD-10-CM | POA: Insufficient documentation

## 2020-04-15 MED ORDER — MUPIROCIN CALCIUM 2 % EX CREA: 1.0000 "application " | TOPICAL_CREAM | Freq: Two times a day (BID) | CUTANEOUS | 0 refills | Status: DC

## 2020-04-15 MED ORDER — LEVOTHYROXINE SODIUM 75 MCG PO TABS: 75.0000 ug | ORAL_TABLET | Freq: Every day | ORAL | 4 refills | Status: DC

## 2020-04-15 NOTE — Progress Notes (Signed)
Virtual Visit via Video Note  I connected with Laura Howell on 04/15/20 at  8:00 AM EDT by a video enabled telemedicine application 2/2 COVID-19 pandemic and verified that I am speaking with the correct person using two identifiers.  Location patient: home Location provider:work or home office Persons participating in the virtual visit: patient, provider  I discussed the limitations of evaluation and management by telemedicine and the availability of in person appointments. The patient expressed understanding and agreed to proceed.   HPI: Pt is a 59 yo female with pmh sig for hypothyroidism, h/o depression, and h/o B12 def.  Pt states her depression is getting better.  She is exercising (hand weights, stationary bike, and walking) 7 days per wk.  Pt has been off thyroid x a few wks.  States she needed another refill.  Was unaware her OB/Gyn filled the rx.  Pt endorses feeling tired and feeling shaky in the afternoon.  Pt with area on her leg x a few wks.  Thinks it is an insect bite on her lower leg.  Tried cortisone cream but the area wont go away.  Erythematous area with a scab in the center .  Non tender.  Had labs with OB/Gyn.  Drinking 1.5-2 glasses of wine per night.  Trying to cut down.  ROS: See pertinent positives and negatives per HPI.  Past Medical History:  Diagnosis Date  . B12 deficiency   . Depression   . Thyroid disease     Past Surgical History:  Procedure Laterality Date  . LAPAROSCOPY  1997   endrometriosis    Family History  Problem Relation Age of Onset  . Heart disease Father   . Heart disease Maternal Grandfather   . Heart disease Paternal Grandfather       Current Outpatient Medications:  .  buPROPion (WELLBUTRIN) 100 MG tablet, Take 100 mg by mouth daily., Disp: , Rfl: 2 .  cyanocobalamin (,VITAMIN B-12,) 1000 MCG/ML injection, INJECT 1 ML INTO THE MUSCLE ONCE FOR 1 DOSE, Disp: 3 mL, Rfl: 3 .  levothyroxine (SYNTHROID) 75 MCG tablet, Take 1 tablet (75  mcg total) by mouth daily. First thing in a.m. and do not eat or drink for 30 minutes, Disp: 90 tablet, Rfl: 4 .  mometasone (NASONEX) 50 MCG/ACT nasal spray, INSTILL 1 SPRAY INTO EACH NOSTRIL ONCE DAILY, Disp: , Rfl: 1 .  traZODone (DESYREL) 100 MG tablet, Take 50 mg by mouth at bedtime., Disp: , Rfl: 2 .  TRINTELLIX 20 MG TABS tablet, Take 20 mg by mouth daily., Disp: , Rfl: 2 .  zolpidem (AMBIEN) 5 MG tablet, Take 1 tablet (5 mg total) by mouth at bedtime as needed for sleep., Disp: 30 tablet, Rfl: 0  EXAM:  VITALS per patient if applicable: RR between 12-20 bpm  GENERAL: alert, oriented, appears well and in no acute distress  HEENT: atraumatic, conjunctiva clear, no obvious abnormalities on inspection of external nose and ears  NECK: normal movements of the head and neck  LUNGS: on inspection no signs of respiratory distress, breathing rate appears normal, no obvious gross SOB, gasping or wheezing  CV: no obvious cyanosis  MS: moves all visible extremities without noticeable abnormality  SKIN:  1 cm erythematous, dry, crusted lesion with irregular borders, slightly raised on lower leg.   No target lesions  PSYCH/NEURO: pleasant and cooperative, no obvious depression or anxiety, speech and thought processing grossly intact  ASSESSMENT AND PLAN:  Discussed the following assessment and plan:  Acquired hypothyroidism -  restart thyroid med. -informed med filled by OB/Gyn, but new rx sent to pharmacy -f/u in 6 wks for TSH recheck  - Plan: levothyroxine (SYNTHROID) 75 MCG tablet  Elevated LFTs -AST 92 and ALT 71 on 01/15/20 -possibly 2/2 daily EtOH intake.  Discussed cutting down.  Avoid quitting cold [place].  Given precautions -will recheck in 6 wks -Plan: CMP  Depression, recurrent ([place]) -improving -continue current meds: Trintellix 20 mg and wellbutrin 100 mg -given precuations  Impetigo  -given precautions - Plan: mupirocin cream (BACTROBAN) 2 %  Alcohol  use -Drinking 1.5 to 2 glasses of wine per night -Pt wishes to cut down -discussed avoiding quitting cold [place] -Given counseling -Given precautions -We will continue to monitor -We will recheck CMP of LFTs previously elevated   Fu in 6 wks for labs, sooner if needed for rash   I discussed the assessment and treatment plan with the patient. The patient was provided an opportunity to ask questions and all were answered. The patient agreed with the plan and demonstrated an understanding of the instructions.   The patient was advised to call back or seek an in-person evaluation if the symptoms worsen or if the condition fails to improve as anticipated.   Billie Ruddy, MD

## 2020-04-15 NOTE — Telephone Encounter (Signed)
Left detailed message for pt to call the office and schedule a 6 weeks f/u appointment  per Dr Salomon Fick

## 2020-04-27 ENCOUNTER — Other Ambulatory Visit: Payer: Self-pay | Admitting: Family Medicine

## 2020-06-24 ENCOUNTER — Encounter: Payer: Self-pay | Admitting: Family Medicine

## 2020-06-24 ENCOUNTER — Other Ambulatory Visit: Payer: Self-pay

## 2020-06-24 ENCOUNTER — Ambulatory Visit (INDEPENDENT_AMBULATORY_CARE_PROVIDER_SITE_OTHER): Payer: 59 | Admitting: Family Medicine

## 2020-06-24 VITALS — BP 108/78 | HR 62 | Temp 97.8°F | Ht 62.0 in | Wt 121.0 lb

## 2020-06-24 DIAGNOSIS — Z789 Other specified health status: Secondary | ICD-10-CM

## 2020-06-24 DIAGNOSIS — R5383 Other fatigue: Secondary | ICD-10-CM | POA: Diagnosis not present

## 2020-06-24 DIAGNOSIS — Z0000 Encounter for general adult medical examination without abnormal findings: Secondary | ICD-10-CM | POA: Diagnosis not present

## 2020-06-24 DIAGNOSIS — E039 Hypothyroidism, unspecified: Secondary | ICD-10-CM | POA: Diagnosis not present

## 2020-06-24 DIAGNOSIS — F1090 Alcohol use, unspecified, uncomplicated: Secondary | ICD-10-CM

## 2020-06-24 DIAGNOSIS — Z1211 Encounter for screening for malignant neoplasm of colon: Secondary | ICD-10-CM

## 2020-06-24 DIAGNOSIS — R0683 Snoring: Secondary | ICD-10-CM | POA: Diagnosis not present

## 2020-06-24 DIAGNOSIS — Z7289 Other problems related to lifestyle: Secondary | ICD-10-CM

## 2020-06-24 LAB — LIPID PANEL
Cholesterol: 203 mg/dL — ABNORMAL HIGH (ref 0–200)
HDL: 83 mg/dL (ref >39.00)
LDL Cholesterol: 106 mg/dL — ABNORMAL HIGH (ref 0–99)
NonHDL: 119.58
Total CHOL/HDL Ratio: 2
Triglycerides: 68 mg/dL (ref 0.0–149.0)
VLDL: 13.6 mg/dL (ref 0.0–40.0)

## 2020-06-24 LAB — VITAMIN D 25 HYDROXY (VIT D DEFICIENCY, FRACTURES): VITD: 47.74 ng/mL (ref 30.00–100.00)

## 2020-06-24 LAB — CBC WITH DIFFERENTIAL/PLATELET
Basophils Absolute: 0.1 10*3/uL (ref 0.0–0.1)
Basophils Relative: 1.9 % (ref 0.0–3.0)
Eosinophils Absolute: 0.1 10*3/uL (ref 0.0–0.7)
Eosinophils Relative: 1.3 % (ref 0.0–5.0)
HCT: 40.2 % (ref 36.0–46.0)
Hemoglobin: 13.6 g/dL (ref 12.0–15.0)
Lymphocytes Relative: 24.4 % (ref 12.0–46.0)
Lymphs Abs: 1.3 10*3/uL (ref 0.7–4.0)
MCHC: 33.9 g/dL (ref 30.0–36.0)
MCV: 104 fl — ABNORMAL HIGH (ref 78.0–100.0)
Monocytes Absolute: 0.5 10*3/uL (ref 0.1–1.0)
Monocytes Relative: 9.9 % (ref 3.0–12.0)
Neutro Abs: 3.2 10*3/uL (ref 1.4–7.7)
Neutrophils Relative %: 62.5 % (ref 43.0–77.0)
Platelets: 275 10*3/uL (ref 150.0–400.0)
RBC: 3.86 Mil/uL — ABNORMAL LOW (ref 3.87–5.11)
RDW: 12.4 % (ref 11.5–15.5)
WBC: 5.2 10*3/uL (ref 4.0–10.5)

## 2020-06-24 LAB — COMPREHENSIVE METABOLIC PANEL
ALT: 41 U/L — ABNORMAL HIGH (ref 0–35)
AST: 54 U/L — ABNORMAL HIGH (ref 0–37)
Albumin: 4.2 g/dL (ref 3.5–5.2)
Alkaline Phosphatase: 66 U/L (ref 39–117)
BUN: 9 mg/dL (ref 6–23)
CO2: 30 mEq/L (ref 19–32)
Calcium: 9.7 mg/dL (ref 8.4–10.5)
Chloride: 101 mEq/L (ref 96–112)
Creatinine, Ser: 0.77 mg/dL (ref 0.40–1.20)
GFR: 77.09 mL/min (ref >60.00)
Glucose, Bld: 89 mg/dL (ref 70–99)
Potassium: 4.4 mEq/L (ref 3.5–5.1)
Sodium: 138 mEq/L (ref 135–145)
Total Bilirubin: 1.2 mg/dL (ref 0.2–1.2)
Total Protein: 6.7 g/dL (ref 6.0–8.3)

## 2020-06-24 LAB — HEMOGLOBIN A1C: Hgb A1c MFr Bld: 4.9 % (ref 4.6–6.5)

## 2020-06-24 LAB — FOLATE: Folate: 15.7 ng/mL (ref >5.9)

## 2020-06-24 LAB — TSH: TSH: 6.5 u[IU]/mL — ABNORMAL HIGH (ref 0.35–4.50)

## 2020-06-24 LAB — VITAMIN B12: Vitamin B-12: 1266 pg/mL — ABNORMAL HIGH (ref 211–911)

## 2020-06-24 NOTE — Patient Instructions (Signed)

## 2020-06-24 NOTE — Progress Notes (Addendum)
Subjective:     Laura Howell is a 59 y.o. female and is here for a comprehensive physical exam. The patient reports several concerns.  Pt states she is doing well overall however endorses fatigue x1 month.  Going to bed at 8:30 PM and waking up at 6 AM.  Denies tossing and turning in sleep.  Taking Ambien 5 mg.  Pt told she snores at night per her husband.  Drinking caffeine in the am.  Able to fall asleep ok.  Exercising with a trainer 2x/wk, lost 15 lbs. Riding stationary bike when not exercising with trainer.  May drink 2 bottles of water per day.  Pt trying to avoid being out in the sun 2/2 recent removal of skin cancer on lateral L shin by Dermatology.  Still working on cutting down wine intake.  Pt states she has been trying to get through to [facility] for refills on Ambien, but the VM is full.    Patient followed by OB/GYN.  Had recent visit.  Patient needs to schedule colonoscopy and mammogram.  Social History   Socioeconomic History  . Marital status: Married    Spouse name: Not on file  . Number of children: Not on file  . Years of education: Not on file  . Highest education level: Not on file  Occupational History  . Not on file  Tobacco Use  . Smoking status: Never Smoker  . Smokeless tobacco: Never Used  Vaping Use  . Vaping Use: Never used  Substance and Sexual Activity  . Alcohol use: Yes    Comment: 2x/week  . Drug use: Never  . Sexual activity: Yes    Birth control/protection: None    Comment: AGE UNKNOWN, LESS THAN 5 SEXUAL PARTNERS,,DES NEG  Other Topics Concern  . Not on file  Social History Narrative  . Not on file   Social Determinants of Health   Financial Resource Strain:   . Difficulty of Paying Living Expenses:   Food Insecurity:   . Worried About Charity fundraiser in the Last Year:   . Arboriculturist in the Last Year:   Transportation Needs:   . Film/video editor (Medical):   Marland Kitchen Lack of Transportation (Non-Medical):   Physical Activity:   .  Days of Exercise per Week:   . Minutes of Exercise per Session:   Stress:   . Feeling of Stress :   Social Connections:   . Frequency of Communication with Friends and Family:   . Frequency of Social Gatherings with Friends and Family:   . Attends Religious Services:   . Active Member of Clubs or Organizations:   . Attends Archivist Meetings:   Marland Kitchen Marital Status:   Intimate Partner Violence:   . Fear of Current or Ex-Partner:   . Emotionally Abused:   Marland Kitchen Physically Abused:   . Sexually Abused:    Health Maintenance  Topic Date Due  . Hepatitis C Screening  Never done  . HIV Screening  Never done  . MAMMOGRAM  10/28/2012  . COLONOSCOPY  Never done  . INFLUENZA VACCINE  07/28/2020  . PAP SMEAR-Modifier  01/14/2023  . TETANUS/TDAP  02/17/2028  . COVID-19 Vaccine  Completed    The following portions of the patient's history were reviewed and updated as appropriate: allergies, current medications, past family history, past medical history, past social history, past surgical history and problem list.  Review of Systems Pertinent items noted in HPI and remainder of comprehensive  ROS otherwise negative.   Objective:    BP 108/78 (BP Location: Left Arm, Patient Position: Sitting, Cuff Size: Normal)   Pulse 62   Temp 97.8 F (36.6 C) (Temporal)   Ht 5\' 2"  (1.575 m)   Wt 121 lb (54.9 kg)   SpO2 98%   BMI 22.13 kg/m  General appearance: alert, cooperative and no distress Head: Normocephalic, without obvious abnormality, atraumatic Eyes: conjunctivae/corneas clear. PERRL, EOM's intact. Fundi benign. Ears: normal TM's and external ear canals both ears Nose: Nares normal. Septum midline. Mucosa normal. No drainage or sinus tenderness. Throat: lips, mucosa, and tongue normal; teeth and gums normal Neck: no adenopathy, no carotid bruit, no JVD, supple, symmetrical, trachea midline and thyroid not enlarged, symmetric, no tenderness/mass/nodules Lungs: clear to auscultation  bilaterally Heart: regular rate and rhythm, S1, S2 normal, no murmur, click, rub or gallop Abdomen: soft, non-tender; bowel sounds normal; no masses,  no organomegaly Extremities: extremities normal, atraumatic, no cyanosis or edema Pulses: 2+ and symmetric Skin: Skin color, texture, turgor normal. No rashes or lesions Lymph nodes: Cervical, supraclavicular, and axillary nodes normal. Neurologic: Alert and oriented X 3, normal strength and tone. Normal symmetric reflexes. Normal coordination and gait    Assessment:    Healthy female exam with concern for fatigue and snoring.     Plan:     Anticipatory guidance given including wearing seatbelts, smoke detectors in the home, increasing physical activity, increasing p.o. intake of water and vegetables. -will obtain labs -Patient given information to schedule mammogram. -Followed by OB/GYN for Pap -We will place referral for colonoscopy -Given handout -Next CPE in 1 year See After Visit Summary for Counseling Recommendations    Acquired hypothyroidism -Continue levothyroxine 75 mcg daily.  Will adjust dose if needed based on labs  - Plan: TSH  Alcohol use -Continue cutting down -Continue follow-up with [facility] -Plan: CMP, CBC Colon cancer screening  - Plan: Ambulatory referral to Gastroenterology  Fatigue, unspecified type -PHQ 9 score 5 -GAD 7 score 5 -Discussed possible causes including changes in thyroid medication requirements, snoring, vitamin deficiency -Continue exercise encouraged -We will obtain labs -We will place referral for sleep study -Given handout - Plan: CBC with Differential/Platelet, TSH, Hemoglobin A1c, Comprehensive metabolic panel, Vitamin D, 25-hydroxy, Vitamin B12  Snoring  -Recent weight loss should help with snoring -Consider changing positions when sleeping -Discussed obtaining sleep study. - Plan: Ambulatory referral to Pulmonology  Follow-up in 1 month, sooner if needed  ,  MD

## 2020-06-28 ENCOUNTER — Other Ambulatory Visit: Payer: Self-pay | Admitting: Family Medicine

## 2020-06-28 DIAGNOSIS — F1010 Alcohol abuse, uncomplicated: Secondary | ICD-10-CM

## 2020-06-28 DIAGNOSIS — E039 Hypothyroidism, unspecified: Secondary | ICD-10-CM

## 2020-06-28 DIAGNOSIS — R7989 Other specified abnormal findings of blood chemistry: Secondary | ICD-10-CM

## 2020-06-28 MED ORDER — LEVOTHYROXINE SODIUM 88 MCG PO TABS: 88.0000 ug | ORAL_TABLET | Freq: Every day | ORAL | 3 refills | Status: DC

## 2020-07-15 ENCOUNTER — Telehealth: Payer: Self-pay | Admitting: Family Medicine

## 2020-07-15 NOTE — Telephone Encounter (Signed)
Returning call for lab results. [phone]

## 2020-07-22 NOTE — Telephone Encounter (Signed)
Left a detailed message for pt to call the office back for her lab results

## 2020-07-30 NOTE — Telephone Encounter (Signed)
No success of speaking with pt, left few messages for pt and  a letter sent out for pt to call the office for lab results

## 2020-08-22 ENCOUNTER — Other Ambulatory Visit: Payer: Self-pay | Admitting: Women's Health

## 2020-08-22 DIAGNOSIS — Z1231 Encounter for screening mammogram for malignant neoplasm of breast: Secondary | ICD-10-CM

## 2020-08-29 ENCOUNTER — Other Ambulatory Visit: Payer: Self-pay

## 2020-08-29 ENCOUNTER — Ambulatory Visit
Admission: RE | Admit: 2020-08-29 | Discharge: 2020-08-29 | Disposition: A | Discharge status: Home / Self Care | Payer: 59 | Source: Ambulatory Visit | Attending: *Deleted | Admitting: *Deleted

## 2020-08-29 DIAGNOSIS — Z1231 Encounter for screening mammogram for malignant neoplasm of breast: Secondary | ICD-10-CM

## 2020-10-21 ENCOUNTER — Other Ambulatory Visit: Payer: Self-pay | Admitting: Family Medicine

## 2020-10-21 NOTE — Telephone Encounter (Signed)
Lab result note 06/24/20:  Your B12 level was elevated at 1266. If you are taking a daily B12 supplement consider taking it every other day.  Okay to refill?

## 2020-10-22 ENCOUNTER — Emergency Department (HOSPITAL_COMMUNITY): Payer: 59

## 2020-10-22 ENCOUNTER — Other Ambulatory Visit: Payer: Self-pay

## 2020-10-22 ENCOUNTER — Encounter (HOSPITAL_COMMUNITY): Payer: Self-pay

## 2020-10-22 ENCOUNTER — Emergency Department (HOSPITAL_COMMUNITY)
Admission: EM | Admit: 2020-10-22 | Discharge: 2020-10-22 | Disposition: A | Discharge status: Home / Self Care | Payer: 59 | Attending: Emergency Medicine | Admitting: Emergency Medicine

## 2020-10-22 DIAGNOSIS — R251 Tremor, unspecified: Secondary | ICD-10-CM | POA: Diagnosis not present

## 2020-10-22 DIAGNOSIS — R079 Chest pain, unspecified: Secondary | ICD-10-CM | POA: Diagnosis not present

## 2020-10-22 DIAGNOSIS — Z20822 Contact with and (suspected) exposure to covid-19: Secondary | ICD-10-CM | POA: Insufficient documentation

## 2020-10-22 DIAGNOSIS — R7989 Other specified abnormal findings of blood chemistry: Secondary | ICD-10-CM | POA: Diagnosis not present

## 2020-10-22 DIAGNOSIS — E161 Other hypoglycemia: Secondary | ICD-10-CM | POA: Diagnosis not present

## 2020-10-22 DIAGNOSIS — R5383 Other fatigue: Secondary | ICD-10-CM | POA: Insufficient documentation

## 2020-10-22 DIAGNOSIS — R0789 Other chest pain: Secondary | ICD-10-CM | POA: Diagnosis not present

## 2020-10-22 DIAGNOSIS — E039 Hypothyroidism, unspecified: Secondary | ICD-10-CM | POA: Insufficient documentation

## 2020-10-22 DIAGNOSIS — R002 Palpitations: Secondary | ICD-10-CM | POA: Diagnosis not present

## 2020-10-22 DIAGNOSIS — E162 Hypoglycemia, unspecified: Secondary | ICD-10-CM | POA: Diagnosis not present

## 2020-10-22 DIAGNOSIS — Z79899 Other long term (current) drug therapy: Secondary | ICD-10-CM | POA: Insufficient documentation

## 2020-10-22 DIAGNOSIS — R42 Dizziness and giddiness: Secondary | ICD-10-CM | POA: Insufficient documentation

## 2020-10-22 LAB — BASIC METABOLIC PANEL
Anion gap: 10 (ref 5–15)
BUN: 8 mg/dL (ref 6–20)
CO2: 24 mmol/L (ref 22–32)
Calcium: 9.6 mg/dL (ref 8.9–10.3)
Chloride: 100 mmol/L (ref 98–111)
Creatinine, Ser: 0.79 mg/dL (ref 0.44–1.00)
GFR, Estimated: >60 mL/min (ref >60)
Glucose, Bld: 85 mg/dL (ref 70–99)
Potassium: 4.2 mmol/L (ref 3.5–5.1)
Sodium: 134 mmol/L — ABNORMAL LOW (ref 135–145)

## 2020-10-22 LAB — HEPATIC FUNCTION PANEL
ALT: 45 U/L — ABNORMAL HIGH (ref 0–44)
AST: 53 U/L — ABNORMAL HIGH (ref 15–41)
Albumin: 3.6 g/dL (ref 3.5–5.0)
Alkaline Phosphatase: 59 U/L (ref 38–126)
Bilirubin, Direct: 0.2 mg/dL (ref 0.0–0.2)
Indirect Bilirubin: 1.4 mg/dL — ABNORMAL HIGH (ref 0.3–0.9)
Total Bilirubin: 1.6 mg/dL — ABNORMAL HIGH (ref 0.3–1.2)
Total Protein: 6.4 g/dL — ABNORMAL LOW (ref 6.5–8.1)

## 2020-10-22 LAB — CBC
HCT: 42.2 % (ref 36.0–46.0)
Hemoglobin: 14.1 g/dL (ref 12.0–15.0)
MCH: 34 pg (ref 26.0–34.0)
MCHC: 33.4 g/dL (ref 30.0–36.0)
MCV: 101.7 fL — ABNORMAL HIGH (ref 80.0–100.0)
Platelets: 287 10*3/uL (ref 150–400)
RBC: 4.15 MIL/uL (ref 3.87–5.11)
RDW: 12 % (ref 11.5–15.5)
WBC: 5.6 10*3/uL (ref 4.0–10.5)
nRBC: 0 % (ref 0.0–0.2)

## 2020-10-22 LAB — TROPONIN I (HIGH SENSITIVITY)
Troponin I (High Sensitivity): 3 ng/L (ref <18)
Troponin I (High Sensitivity): <2 ng/L (ref <18)

## 2020-10-22 LAB — RESPIRATORY PANEL BY RT PCR (FLU A&B, COVID)
Influenza A by PCR: NEGATIVE
Influenza B by PCR: NEGATIVE
SARS Coronavirus 2 by RT PCR: NEGATIVE

## 2020-10-22 LAB — MAGNESIUM: Magnesium: 1.8 mg/dL (ref 1.7–2.4)

## 2020-10-22 LAB — LIPASE, BLOOD: Lipase: 36 U/L (ref 11–51)

## 2020-10-22 MED ORDER — SODIUM CHLORIDE 0.9 % IV BOLUS
500.0000 mL | Freq: Once | INTRAVENOUS | Status: AC
  Administered 2020-10-22: 500 mL via INTRAVENOUS

## 2020-10-22 MED ORDER — PANTOPRAZOLE SODIUM 20 MG PO TBEC: 20.0000 mg | DELAYED_RELEASE_TABLET | Freq: Every day | ORAL | 0 refills | Status: DC

## 2020-10-22 NOTE — ED Triage Notes (Signed)
Pt bib ems from work for intermittent episodes of chest pain, dizziness, palpitations and left arm pain. Pt reports she has been having the episodes for the past few weeks but today was the worst. No cardiac hx. EMS reports pt was very clammy and diaphoretic upon their arrival but pt denied any chest pain. Pt shaky in triage but denies pain, a.o. nad noted

## 2020-10-22 NOTE — Discharge Instructions (Addendum)
At this time there does not appear to be the presence of an emergent medical condition, however there is always the potential for conditions to change. Please read and follow the below instructions.  Please return to the Emergency Department immediately for any new or worsening symptoms. Please be sure to follow up with your Primary Care Provider within one week regarding your visit today; please call their office to schedule an appointment even if you are feeling better for a follow-up visit. Your Covid and flu tests are pending and those results will be available on your MyChart account within the next 24 hours.  Please discuss those results with your primary care provider at your follow-up visit. You may start taking the medication Protonix as prescribed to help with acid reflux related symptoms.  Go to the nearest Emergency Department immediately if: You have fever or chills You have a cough that gets worse, or you cough up blood. You have very bad (severe) pain in your belly (abdomen). You pass out (faint). You have either of these for no clear reason: Sudden chest discomfort. Sudden discomfort in your arms, back, neck, or jaw. You have shortness of breath at any time. You suddenly start to sweat, or your skin gets clammy. You feel sick to your stomach (nauseous). You throw up (vomit). You suddenly feel lightheaded or dizzy. You feel very weak or tired. Your heart starts to beat fast, or it feels like it is skipping beats. You have any new/concerning or worsening of symptoms  Please read the additional information packets attached to your discharge summary.  Do not take your medicine if  develop an itchy rash, swelling in your mouth or lips, or difficulty breathing; call 911 and seek immediate emergency medical attention if this occurs.  You may review your lab tests and imaging results in their entirety on your MyChart account.  Please discuss all results of fully with your primary  care provider and other specialist at your follow-up visit.  Note: Portions of this text may have been transcribed using voice recognition software. Every effort was made to ensure accuracy; however, inadvertent computerized transcription errors may still be present.

## 2020-10-22 NOTE — ED Provider Notes (Signed)
[facility] Provider Note   CSN: [id number] Arrival date & time: 10/22/20  1741     History Chief Complaint  Patient presents with  . Chest Pain  . Dizziness  . Palpitations    Laura Howell is a 59 y.o. female history of thyroid disease, B12 deficiency, depression.  Patient brought in today by EMS for chest pain started while sitting this morning with no exertional symptoms, described as a pressure sensation started in her left arm that radiated to her chest, moderate intensity no clear aggravating or alleviating factors.  Associated with shakiness, general fatigue and diaphoresis.  She reports she has had similar episodes of shakiness and fatigue for the past several weeks, this mostly occurs when she is sitting in her chair at work not associated with movement or exertion.  Denies fever/chills, fall/injury, headache, vision changes, neck pain, cough/shortness of breath, vomiting, diarrhea, unilateral numbness/weakness, tingling, extremity swelling/color change or any additional concerns.  Patient denies history of smoking, denies high cholesterol, diabetes, hypertension, family history of MI under the age of 58, PVD, CVA/TIA. HPI     Past Medical History:  Diagnosis Date  . B12 deficiency   . Depression   . Thyroid disease     Patient Active Problem List   Diagnosis Date Noted  . Elevated LFTs 04/15/2020  . Alcohol use 04/15/2020  . Acquired hypothyroidism 06/17/2007  . Depression, recurrent (HCC) 06/17/2007    Past Surgical History:  Procedure Laterality Date  . LAPAROSCOPY  1997   endrometriosis     OB History    Gravida  0   Para  0   Term  0   Preterm  0   AB  0   Living  0     SAB  0   TAB  0   Ectopic  0   Multiple  0   Live Births  0           Family History  Problem Relation Age of Onset  . Heart disease Father   . Heart disease Maternal Grandfather   . Heart disease Paternal  Grandfather     Social History   Tobacco Use  . Smoking status: Never Smoker  . Smokeless tobacco: Never Used  Vaping Use  . Vaping Use: Never used  Substance Use Topics  . Alcohol use: Yes    Comment: 2x/week  . Drug use: Never    Home Medications Prior to Admission medications   Medication Sig Start Date End Date Taking? Authorizing Provider  buPROPion (WELLBUTRIN) 100 MG tablet Take 100 mg by mouth daily. 09/11/18   [provider]  cyanocobalamin (,VITAMIN B-12,) 1000 MCG/ML injection INJECT 1 ML INTO THE MUSCLE ONCE FOR 1 DOSE 04/30/20   Deeann SaintBanks, Shannon R, MD  levothyroxine (SYNTHROID) 88 MCG tablet Take 1 tablet (88 mcg total) by mouth daily. 06/28/20   Deeann SaintBanks, Shannon R, MD  mometasone (NASONEX) 50 MCG/ACT nasal spray INSTILL 1 SPRAY INTO EACH NOSTRIL ONCE DAILY 08/15/18   [provider]  mupirocin cream (BACTROBAN) 2 % Apply 1 application topically 2 (two) times daily. 04/15/20   Deeann SaintBanks, Shannon R, MD  pantoprazole (PROTONIX) 20 MG tablet Take 1 tablet (20 mg total) by mouth daily. 10/22/20   Harlene SaltsMorelli, Conya Ellinwood A, PA-C  traZODone (DESYREL) 100 MG tablet Take 50 mg by mouth at bedtime. 09/11/18   [provider]  TRINTELLIX 20 MG TABS tablet Take 20 mg by mouth daily. 09/11/18  [provider]  zolpidem (AMBIEN) 5 MG tablet Take 1 tablet (5 mg total) by mouth at bedtime as needed for sleep. 03/31/19   Deeann Saint, MD    Allergies    Erythromycin and Tetracyclines & related  Review of Systems   Review of Systems Ten systems are reviewed and are negative for acute change except as noted in the HPI  Physical Exam Updated Vital Signs BP (!) 146/85 (BP Location: Right Arm)   Pulse 64   Temp 98.1 F (36.7 C) (Oral)   Resp 16   SpO2 98%   Physical Exam Constitutional:      General: She is not in acute distress.    Appearance: Normal appearance. She is well-developed. She is not ill-appearing or diaphoretic.  HENT:     Head: Normocephalic  and atraumatic.  Eyes:     General: Vision grossly intact. Gaze aligned appropriately.     Pupils: Pupils are equal, round, and reactive to light.  Neck:     Trachea: Trachea and phonation normal.  Cardiovascular:     Rate and Rhythm: Normal rate and regular rhythm.     Pulses:          Dorsalis pedis pulses are 2+ on the right side and 2+ on the left side.  Pulmonary:     Effort: Pulmonary effort is normal. No respiratory distress.     Breath sounds: Normal breath sounds.  Abdominal:     General: There is no distension.     Palpations: Abdomen is soft.     Tenderness: There is no abdominal tenderness. There is no guarding or rebound.  Musculoskeletal:        General: Normal range of motion.     Cervical back: Normal range of motion.     Right lower leg: No tenderness. No edema.     Left lower leg: No tenderness. No edema.  Feet:     Right foot:     Protective Sensation: 3 sites tested. 3 sites sensed.     Left foot:     Protective Sensation: 3 sites tested. 3 sites sensed.  Skin:    General: Skin is warm and dry.  Neurological:     Mental Status: She is alert.     GCS: GCS eye subscore is 4. GCS verbal subscore is 5. GCS motor subscore is 6.     Comments: Speech is clear and goal oriented, follows commands Major Cranial nerves without deficit, no facial droop Moves extremities without ataxia, coordination intact  Fine tremor noted  Psychiatric:        Behavior: Behavior normal.     ED Results / Procedures / Treatments   Labs (all labs ordered are listed, but only abnormal results are displayed) Labs Reviewed  BASIC METABOLIC PANEL - Abnormal; Notable for the following components:      Result Value   Sodium 134 (*)    All other components within normal limits  CBC - Abnormal; Notable for the following components:   MCV 101.7 (*)    All other components within normal limits  HEPATIC FUNCTION PANEL - Abnormal; Notable for the following components:   Total Protein  6.4 (*)    AST 53 (*)    ALT 45 (*)    Total Bilirubin 1.6 (*)    Indirect Bilirubin 1.4 (*)    All other components within normal limits  RESPIRATORY PANEL BY RT PCR (FLU A&B, COVID)  LIPASE, BLOOD  MAGNESIUM  TROPONIN  I (HIGH SENSITIVITY)  TROPONIN I (HIGH SENSITIVITY)    EKG EKG Interpretation  Date/Time:  Tuesday October 22 2020 17:48:11 EDT Ventricular Rate:  62 PR Interval:  170 QRS Duration: 88 QT Interval:  428 QTC Calculation: 434 R Axis:   27 Text Interpretation: Normal sinus rhythm Normal ECG No significant change since last tracing Confirmed by Linwood Dibbles [phone]) on 10/22/2020 8:41:15 PM   Radiology DG Chest 2 View  Result Date: 10/22/2020 CLINICAL DATA:  Chest pain.  Dizziness.  Palpitations. EXAM: CHEST - 2 VIEW COMPARISON:  None. FINDINGS: Midline trachea. Normal heart size. No pleural effusion or pneumothorax. Diffuse peribronchial thickening. No lobar consolidation. IMPRESSION: No acute cardiopulmonary disease. Diffuse mild interstitial thickening which can be seen with chronic bronchitis or smoking. Electronically Signed   By: Jeronimo Greaves M.D.   On: 10/22/2020 18:58    Procedures Procedures (including critical care time)  Medications Ordered in ED Medications  sodium chloride 0.9 % bolus 500 mL (0 mLs Intravenous Stopped 10/22/20 2000)    ED Course  I have reviewed the triage vital signs and the nursing notes.  Pertinent labs & imaging results that were available during my care of the patient were reviewed by me and considered in my medical decision making (see chart for details).    MDM Rules/Calculators/A&P                         Additional history obtained from: 1. Nursing notes from this visit. 2. Review of electronic medical records. ---------------------------- 59 year old female presented today for chest pain began in her left arm earlier today.  She has been experiencing some fatigue and generalized weakness for several weeks.  Chest  pain-free on arrival.  Cranial nerves intact, no meningeal signs, cardiopulmonary exam unremarkable, abdomen soft nontender without peritoneal signs, neurovascular tact to all 4 extremities without evidence of DVT.  Fine tremor noted on examination. - I ordered, reviewed and interpreted labs which include: CBC without anemia or leukocytosis. BMP shows no emergent electrolyte derangement, AKI or gap. Lipase within normal limits. Magnesium within normal limits. Initial and delta high-sensitivity troponins within normal limits. LFT showed mild elevation of AST and ALT, mild elevation of total bilirubin, no RUQ tenderness or abdominal tenderness to suggest biliary disease.  CXR:  IMPRESSION:  No acute cardiopulmonary disease.    Diffuse mild interstitial thickening which can be seen with chronic  bronchitis or smoking.     EKG: Normal sinus rhythm Normal ECG No significant change since last tracing Confirmed by Linwood Dibbles [phone]) on 10/22/2020 8:41:15 PM ----- HEART score <4, low suspicion for ACS as etiology of patient's pain today.  Additionally history and presentation is inconsistent with dissection, PE, pneumonia or other emergent cardiopulmonary etiologies at this time.  There is no indication for admission.  Additionally the mild elevation of the LFTs were noted along with a fine motor tremor.  Patient's husband was asked to leave the room and this was discussed with the patient, she notes that she does drink alcohol every night, feel this may be contributing to the patient's generalized weakness and the tremors that she has been experiencing, patient did not wish to discuss this with her husband or express willingness for EtOH cessation.  Will start patient on PPI for possible GERD etiology of patient's pain today and encouraged her to follow-up with her PCP.  Patient does not appear to be in acute alcohol withdrawal at this time appears stable for discharge  At discharge patient's  Covid/influenza panel is negative she will follow-up with her PCP with those test results when available.  At this time there does not appear to be any evidence of an acute emergency medical condition and the patient appears stable for discharge with appropriate outpatient follow up. Diagnosis was discussed with patient who verbalizes understanding of care plan and is agreeable to discharge. I have discussed return precautions with patientwho verbalizes understanding. Patient encouraged to follow-up with their PCP. All questions answered.  Patient's case discussed with Dr. Lynelle Doctor who agrees with plan to discharge with PCP follow-up.   Note: Portions of this report may have been transcribed using voice recognition software. Every effort was made to ensure accuracy; however, inadvertent computerized transcription errors may still be present. Final Clinical Impression(s) / ED Diagnoses Final diagnoses:  Atypical chest pain  Elevated LFTs    Rx / DC Orders ED Discharge Orders         Ordered    pantoprazole (PROTONIX) 20 MG tablet  Daily        10/22/20 2057           Elizabeth Palau 10/22/20 2103    Linwood Dibbles, MD 10/27/20 [phone]

## 2021-01-22 ENCOUNTER — Ambulatory Visit: Payer: 59 | Admitting: Family Medicine

## 2021-01-22 ENCOUNTER — Other Ambulatory Visit: Payer: Self-pay

## 2021-01-22 ENCOUNTER — Encounter: Payer: Self-pay | Admitting: Family Medicine

## 2021-01-22 VITALS — BP 108/72 | HR 74 | Temp 98.2°F | Wt 124.0 lb

## 2021-01-22 DIAGNOSIS — R7989 Other specified abnormal findings of blood chemistry: Secondary | ICD-10-CM | POA: Diagnosis not present

## 2021-01-22 DIAGNOSIS — J302 Other seasonal allergic rhinitis: Secondary | ICD-10-CM

## 2021-01-22 DIAGNOSIS — F1010 Alcohol abuse, uncomplicated: Secondary | ICD-10-CM

## 2021-01-22 DIAGNOSIS — E039 Hypothyroidism, unspecified: Secondary | ICD-10-CM | POA: Diagnosis not present

## 2021-01-22 MED ORDER — FLUTICASONE PROPIONATE 50 MCG/ACT NA SUSP: 1.0000 | Freq: Every day | NASAL | 4 refills | Status: DC

## 2021-01-22 NOTE — Progress Notes (Signed)
Subjective:    Patient ID: Laura Howell, female    DOB: 08/03/1961, 60 y.o.   MRN: [id number]  No chief complaint on file.   HPI Patient was seen today for f/u on Thyroid.  TSH was 6.50 on 06/24/20.  Pt was to return in 4-6 wks for recheck, but forgot.  Pt notes she is taking synthroid 88 mcg after eating breakfast.  Pt inquires about medication for EtOH use.  Drinking 1 beer and one full glass of wine per night.  Denies needing an eye opener.  Hands shake if she does not drink.  States drinking helps her fall asleep.  Pt with tingling in hands/R arm when brushing her teeth or her hair.  Also with decreased muscle mass in hands and dry skin.  Had several episodes of feeling flush/anxious.  Was seen in ED on 10/22/20 for atypical CP.  Endorses allergy sxs.  Past Medical History:  Diagnosis Date  . B12 deficiency   . Depression   . Thyroid disease     Allergies  Allergen Reactions  . Erythromycin Rash  . Tetracyclines & Related Rash    ROS General: Denies fever, chills, night sweats, changes in weight, changes in appetite HEENT: Denies headaches, ear pain, changes in vision, rhinorrhea, sore throat +allergy sxs CV: Denies CP, palpitations, SOB, orthopnea Pulm: Denies SOB, cough, wheezing GI: Denies abdominal pain, nausea, vomiting, diarrhea, constipation GU: Denies dysuria, hematuria, frequency, vaginal discharge Msk: Denies muscle cramps, joint pains Neuro: Denies weakness, +numbness, tingling, tremor without EtOH, decreased muscle mass Skin: Denies rashes, bruising Psych: Denies depression, hallucinations +anxiety     Objective:    Blood pressure 108/72, pulse 74, temperature 98.2 F (36.8 C), temperature source Oral, weight 124 lb (56.2 kg), SpO2 98 %.  Gen. Pleasant, well-nourished, in no distress, normal affect   HEENT: [place]/AT, face symmetric, conjunctiva clear, no scleral icterus, PERRLA, EOMI, nares patent without drainage Lungs: no accessory muscle use, CTAB, no  wheezes or rales Cardiovascular: RRR, no m/r/g, no peripheral edema Musculoskeletal: No deformities, no cyanosis or clubbing, normal tone Neuro:  A&Ox3, CN II-XII intact, normal gait Skin:  Warm, no lesions/ rash   Wt Readings from Last 3 Encounters:  01/22/21 124 lb (56.2 kg)  06/24/20 121 lb (54.9 kg)  04/15/20 122 lb (55.3 kg)    Lab Results  Component Value Date   WBC 5.6 10/22/2020   HGB 14.1 10/22/2020   HCT 42.2 10/22/2020   PLT 287 10/22/2020   GLUCOSE 85 10/22/2020   CHOL 203 (H) 06/24/2020   TRIG 68.0 06/24/2020   HDL 83.00 06/24/2020   LDLCALC 106 (H) 06/24/2020   ALT 45 (H) 10/22/2020   AST 53 (H) 10/22/2020   NA 134 (L) 10/22/2020   K 4.2 10/22/2020   CL 100 10/22/2020   CREATININE 0.79 10/22/2020   BUN 8 10/22/2020   CO2 24 10/22/2020   TSH 6.50 (H) 06/24/2020   HGBA1C 4.9 06/24/2020    Assessment/Plan:  Acquired hypothyroidism -continue synthroid 88 mg daily  - Plan: TSH  ETOH abuse  -discussed cutting down EtOH use -given info on community resources -given strict precautions - Plan: Comprehensive metabolic panel, Folate, Vitamin B12, CBC with Differential/Platelet  Elevated LFTs  -likely 2/2 EtOH use -discussed cutting down -will obtain labs - Plan: Comprehensive metabolic panel  Seasonal allergies  - Plan: fluticasone (FLONASE) 50 MCG/ACT nasal spray  F/u in 1 month  Abbe Amsterdam, MD

## 2021-01-22 NOTE — Patient Instructions (Signed)
Hypothyroidism  Hypothyroidism is when the thyroid gland does not make enough of certain hormones (it is underactive). The thyroid gland is a small gland located in the lower front part of the neck, just in front of the windpipe (trachea). This gland makes hormones that help control how the body uses food for energy (metabolism) as well as how the heart and brain function. These hormones also play a role in keeping your bones strong. When the thyroid is underactive, it produces too little of the hormones thyroxine (T4) and triiodothyronine (T3). What are the causes? This condition may be caused by:  Hashimoto's disease. This is a disease in which the body's disease-fighting system (immune system) attacks the thyroid gland. This is the most common cause.  Viral infections.  Pregnancy.  Certain medicines.  Birth defects.  Past radiation treatments to the head or neck for cancer.  Past treatment with radioactive iodine.  Past exposure to radiation in the environment.  Past surgical removal of part or all of the thyroid.  Problems with a gland in the center of the brain (pituitary gland).  Lack of enough iodine in the diet. What increases the risk? You are more likely to develop this condition if:  You are female.  You have a family history of thyroid conditions.  You use a medicine called lithium.  You take medicines that affect the immune system (immunosuppressants). What are the signs or symptoms? Symptoms of this condition include:  Feeling as though you have no energy (lethargy).  Not being able to tolerate cold.  Weight gain that is not explained by a change in diet or exercise habits.  Lack of appetite.  Dry skin.  Coarse hair.  Menstrual irregularity.  Slowing of thought processes.  Constipation.  Sadness or depression. How is this diagnosed? This condition may be diagnosed based on:  Your symptoms, your medical history, and a physical exam.  Blood  tests. You may also have imaging tests, such as an ultrasound or MRI. How is this treated? This condition is treated with medicine that replaces the thyroid hormones that your body does not make. After you begin treatment, it may take several weeks for symptoms to go away. Follow these instructions at home:  Take over-the-counter and prescription medicines only as told by your health care provider.  If you start taking any new medicines, tell your health care provider.  Keep all follow-up visits as told by your health care provider. This is important. ? As your condition improves, your dosage of thyroid hormone medicine may change. ? You will need to have blood tests regularly so that your health care provider can monitor your condition. Contact a health care provider if:  Your symptoms do not get better with treatment.  You are taking thyroid hormone replacement medicine and you: ? Sweat a lot. ? Have tremors. ? Feel anxious. ? Lose weight rapidly. ? Cannot tolerate heat. ? Have emotional swings. ? Have diarrhea. ? Feel weak. Get help right away if you have:  Chest pain.  An irregular heartbeat.  A rapid heartbeat.  Difficulty breathing. Summary  Hypothyroidism is when the thyroid gland does not make enough of certain hormones (it is underactive).  When the thyroid is underactive, it produces too little of the hormones thyroxine (T4) and triiodothyronine (T3).  The most common cause is Hashimoto's disease, a disease in which the body's disease-fighting system (immune system) attacks the thyroid gland. The condition can also be caused by viral infections, medicine, pregnancy, or   past radiation treatment to the head or neck.  Symptoms may include weight gain, dry skin, constipation, feeling as though you do not have energy, and not being able to tolerate cold.  This condition is treated with medicine to replace the thyroid hormones that your body does not make. This  information is not intended to replace advice given to you by your health care provider. Make sure you discuss any questions you have with your health care provider. Document Revised: 09/13/2020 Document Reviewed: 08/29/2020 Elsevier Patient Education  2021 Elsevier Inc.  Alcohol Abuse and Dependence Information, Adult Alcohol is a widely available drug. People drink alcohol in different amounts. People who drink alcohol very often and in large amounts often have problems during and after drinking. They may develop what is called an alcohol use disorder. There are two main types of alcohol use disorders:  Alcohol abuse. This is when you use alcohol too much or too often. You may use alcohol to make yourself feel happy or to reduce stress. You may have a hard time setting a limit on the amount you drink.  Alcohol dependence. This is when you use alcohol consistently for a period of time, and your body changes as a result. This can make it hard to stop drinking because you may start to feel sick or feel different when you do not use alcohol. These symptoms are known as withdrawal. How can alcohol abuse and dependence affect me? Alcohol abuse and dependence can have a negative effect on your life. Drinking too much can lead to addiction. You may feel like you need alcohol to function normally. You may drink alcohol before work in the morning, during the day, or as soon as you get home from work in the evening. These actions can result in:  Poor work performance.  Job loss.  Financial problems.  Car crashes or criminal charges from driving after drinking alcohol.  Problems in your relationships with friends and family.  Losing the trust and respect of coworkers, friends, and family. Drinking heavily over a long period of time can permanently damage your body and brain, and can cause lifelong health issues, such as:  Damage to your liver or pancreas.  Heart problems, high blood pressure, or  stroke.  Certain cancers.  Decreased ability to fight infections.  Brain or nerve damage.  Depression.  Early (premature) death. If you are careless or you crave alcohol, it is easy to drink more than your body can handle (overdose). Alcohol overdose is a serious situation that requires hospitalization. It may lead to permanent injuries or death. What can increase my risk?  Having a family history of alcohol abuse.  Having depression or other mental health conditions.  Beginning to drink at an early age.  Binge drinking often.  Experiencing trauma, stress, and an unstable home life during childhood.  Spending time with people who drink often. What actions can I take to prevent or manage alcohol abuse and dependence?  Do not drink alcohol if: ? Your health care provider tells you not to drink. ? You are pregnant, may be pregnant, or are planning to become pregnant.  If you drink alcohol: ? Limit how much you use to:  0-1 drink a day for women.  0-2 drinks a day for men. ? Be aware of how much alcohol is in your drink. In the [place] one drink equals one 12 oz bottle of beer (355 mL), one 5 oz glass of wine (148 mL), or one 1  oz glass of hard liquor (44 mL).  Stop drinking if you have been drinking too much. This can be very hard to do if you are used to abusing alcohol. If you begin to have withdrawal symptoms, talk with your health care provider or a person that you trust. These symptoms may include anxiety, shaky hands, headache, nausea, sweating, or not being able to sleep.  Choose to drink nonalcoholic beverages in social gatherings and places where there may be alcohol. Activity  Spend more time on activities that you enjoy that do not involve alcohol, like hobbies or exercise.  Find healthy ways to cope with stress, such as exercise, meditation, or spending time with people you care about. General information  Talk to your family, coworkers, and friends about  supporting you in your efforts to stop drinking. If they drink, ask them not to drink around you. Spend more time with people who do not drink alcohol.  If you think that you have an alcohol dependency problem: ? Tell friends or family about your concerns. ? Talk with your health care provider or another health professional about where to get help. ? Work with a Paramedic and a Network engineer. ? Consider joining a support group for people who struggle with alcohol abuse and dependence. Where to find support  Your health care provider.  SMART Recovery: www.smartrecovery.org Therapy and support groups  Local treatment centers or chemical dependency counselors.  Local AA groups in your community: SalaryStart.tn   Where to find more information  Centers for Disease Control and Prevention: FootballExhibition.com.br  [employer] on Alcohol Abuse and Alcoholism: BasicStudents.dk  Alcoholics Anonymous (AA): SalaryStart.tn Contact a health care provider if:  You drank more or for longer than you intended on more than one occasion.  You tried to stop drinking or to cut back on how much you drink, but you were not able to.  You often drink to the point of vomiting or passing out.  You want to drink so badly that you cannot think about anything else.  You have problems in your life due to drinking, but you continue to drink.  You keep drinking even though you feel anxious, depressed, or have experienced memory loss.  You have stopped doing the things you used to enjoy in order to drink.  You have to drink more than you used to in order to get the effect you want.  You experience anxiety, sweating, nausea, shakiness, and trouble sleeping when you try to stop drinking. Get help right away if:  You have thoughts about hurting yourself or others.  You have serious withdrawal symptoms, including: ? Confusion. ? Racing heart. ? High blood pressure. ? Fever. If you ever feel like  you may hurt yourself or others, or have thoughts about taking your own life, get help right away. You can go to your nearest emergency department or call:  Your local emergency services (911 in the [place]  A suicide crisis helpline, such as the National Suicide Prevention Lifeline at 581 402 5054. This is open 24 hours a day. Summary  Alcohol abuse and dependence can have a negative effect on your life. Drinking too much or too often can lead to addiction.  If you drink alcohol, limit how much you use.  If you are having trouble keeping your drinking under control, find ways to change your behavior. Hobbies, calming activities, exercise, or support groups can help.  If you feel you need help with changing your drinking habits, talk with  your health care provider, a good friend, or a therapist, or go to an AA group. This information is not intended to replace advice given to you by your health care provider. Make sure you discuss any questions you have with your health care provider. Document Revised: 04/04/2019 Document Reviewed: 02/21/2019 Elsevier Patient Education  2021 Elsevier Inc.  Finding Treatment for Addiction Addiction is a complex disease of the brain that causes an uncontrollable (compulsive) need for:  A substance. This includes alcohol, illegal drugs, or prescription medicines, such as painkillers.  An activity or behavior, such as gambling or shopping. Addiction changes the way your brain works. Because of this change:  The need for the medicine, drug, or activity can become so strong that you think about it all the time.  Getting more and more of your addiction becomes the most important thing to you.  You may find yourself leaving other activities and relationships to pursue your addiction.  You can become physically dependent on a substance.  Your health, behavior, emotions, and relationships can change for the worse. How do I know if I need treatment for  addiction? Addiction is a progressive disease. Without treatment, addiction can get worse. Living with addiction puts you at higher risk for injury, poor health, loss of employment, loss of money, and even death. You might need treatment for addiction if:  You have tried to stop or cut down, but you have not succeeded.  You find it annoying that your friends and family are concerned about your use or behavior.  You feel guilty about your use or behavior.  You need a particular substance or activity to start your day or to calm down.  You are running out of money because of your addiction.  You have done something illegal to support your addiction.  Your addiction has caused you: ? Health problems. ? Trouble in school, work, home, or with the police. ? To devote all your time to your addiction, and not to other responsibilities. ? To tell lies in order to hide your problem. What types of treatment are available? There may be options for treatment programs and plans based on your addiction, condition, needs, and preferences. No single treatment is right for everyone.  Treatment programs can be: ? Outpatient. You live at home and go to work or school, but you go to a clinic for treatment. ? Inpatient. You live and sleep at the program facility during treatment.  Programs may include: ? Medicine. You may need medicine to treat the addiction itself, or to treat anxiety or depression. ? Counseling and behavior therapy. This can help individuals and families behave in healthier ways and relate more effectively. ? Support groups. Confidential group therapy, such as a 12-step program, can help individuals and families during treatment and recovery. ? A combination of education, counseling, and a 12-step, spirituality-based approach.   What should I consider when selecting a treatment program? Think about your individual requirements when selecting a treatment program. Ask about:  The  overall approach to treatment. ? Some programs are strictly 12-step programs. Some have a more flexible approach. ? Programs may differ in length of stay, setting, and size. ? Some programs include your family in your treatment plan. Support may be offered to them throughout the treatment process, as well as instructions for them when you are discharged. ? You may continue to receive support after you have left the program.  The types of medical services that are offered. Find out  if the program: ? Offers specific treatment for your particular addiction. ? Meets all of your needs, including physical and cultural needs. ? Includes any medicines you might need. ? Offers mental health counseling as part of your treatment. ? Offers the 12-step meetings at the center, or if transport is available for patients to attend meetings at other locations.  The cost and types of insurance that are accepted. ? Some programs are sponsored by the government. They support patients who do not have private insurance. ? If you do not have insurance, or if you choose to attend a program that does not accept your insurance, call the treatment center. Tell them your financial needs and whether a payment plan can be set up. ? There are also organizations that will help you find the resources for treatment. You can find them online by searching "treatment for addiction."  If the program is certified by the appropriate government agency. Where to find support  Your health care provider can help you to find the right treatment. These discussions are confidential.  The [employer] on Alcoholism and Drug Dependence (NCADD). This group has information about treatment centers and programs for people who have an addiction and for family members. ? Call: 1-800-NCA-CALL (602-762-1737). ? Visit the website: https://www.ncadd.org/  The Substance Abuse and Mental Health Services Administration [facility]). This  organization will help you find publicly funded treatment centers, help hotlines, and counseling services near you. ? Call: 1-800-662-HELP (934-510-9174). ? Visit the website: www.findtreatment.RockToxic.pl  The National Problem Gambling Helpline. This is a 24-hour confidential helpline for gambling addiction. ? Call: 7166697031 ? Visit the website: CocoaInvestor.tn In countries outside of the [place] and [place], look in [employer] for contact information for services in your area. Follow these instructions at home:  Find supportive people who will help you stay away from your addiction and stay sober.  Do not use the substance or engage in the activity.  If you have been through treatment: ? Follow your plan. The plan is usually developed by you and your health care provider during treatment. ? Go to meetings with other people in recovery. ? Avoid people, situations, and things that lead you to do the things you are addicted to (triggers). Summary  Addiction changes the way your brain works. These changes cause a desire to repeat and increase the use of the a substance or behavior.  Addiction is a progressive disease. Without treatment, addiction can get worse. Living with addiction puts you at higher risk for injury, poor health, loss of employment, loss of money, and even death.  There may be options for treatment programs and plans based on your addiction, condition, needs, and preferences. No single treatment is right for everyone.  Your health care provider can help you to find the right treatment. These discussions are confidential. This information is not intended to replace advice given to you by your health care provider. Make sure you discuss any questions you have with your health care provider. Document Revised: 09/06/2019 Document Reviewed: 01/12/2018 Elsevier Patient Education  2021 [employer].

## 2021-02-08 ENCOUNTER — Encounter: Payer: Self-pay | Admitting: Family Medicine

## 2021-06-07 ENCOUNTER — Other Ambulatory Visit: Payer: Self-pay | Admitting: Family Medicine

## 2021-06-07 DIAGNOSIS — E039 Hypothyroidism, unspecified: Secondary | ICD-10-CM

## 2021-06-27 ENCOUNTER — Encounter: Payer: Self-pay | Admitting: Family Medicine

## 2021-06-27 ENCOUNTER — Other Ambulatory Visit: Payer: Self-pay

## 2021-06-27 ENCOUNTER — Ambulatory Visit (INDEPENDENT_AMBULATORY_CARE_PROVIDER_SITE_OTHER): Payer: 59 | Admitting: Family Medicine

## 2021-06-27 VITALS — BP 100/60 | HR 50 | Temp 98.0°F | Ht 62.0 in | Wt 127.2 lb

## 2021-06-27 DIAGNOSIS — L0100 Impetigo, unspecified: Secondary | ICD-10-CM

## 2021-06-27 DIAGNOSIS — Z789 Other specified health status: Secondary | ICD-10-CM

## 2021-06-27 DIAGNOSIS — R41840 Attention and concentration deficit: Secondary | ICD-10-CM | POA: Diagnosis not present

## 2021-06-27 DIAGNOSIS — Z0001 Encounter for general adult medical examination with abnormal findings: Secondary | ICD-10-CM

## 2021-06-27 DIAGNOSIS — Z7289 Other problems related to lifestyle: Secondary | ICD-10-CM

## 2021-06-27 DIAGNOSIS — B372 Candidiasis of skin and nail: Secondary | ICD-10-CM | POA: Diagnosis not present

## 2021-06-27 DIAGNOSIS — Z1159 Encounter for screening for other viral diseases: Secondary | ICD-10-CM | POA: Diagnosis not present

## 2021-06-27 DIAGNOSIS — G4700 Insomnia, unspecified: Secondary | ICD-10-CM

## 2021-06-27 DIAGNOSIS — L309 Dermatitis, unspecified: Secondary | ICD-10-CM

## 2021-06-27 DIAGNOSIS — R5383 Other fatigue: Secondary | ICD-10-CM | POA: Diagnosis not present

## 2021-06-27 DIAGNOSIS — F1010 Alcohol abuse, uncomplicated: Secondary | ICD-10-CM

## 2021-06-27 DIAGNOSIS — R7989 Other specified abnormal findings of blood chemistry: Secondary | ICD-10-CM

## 2021-06-27 DIAGNOSIS — E039 Hypothyroidism, unspecified: Secondary | ICD-10-CM | POA: Diagnosis not present

## 2021-06-27 DIAGNOSIS — Z13220 Encounter for screening for lipoid disorders: Secondary | ICD-10-CM

## 2021-06-27 DIAGNOSIS — R58 Hemorrhage, not elsewhere classified: Secondary | ICD-10-CM | POA: Diagnosis not present

## 2021-06-27 DIAGNOSIS — J302 Other seasonal allergic rhinitis: Secondary | ICD-10-CM

## 2021-06-27 LAB — CBC WITH DIFFERENTIAL/PLATELET
Basophils Absolute: 0.1 10*3/uL (ref 0.0–0.1)
Basophils Relative: 2.1 % (ref 0.0–3.0)
Eosinophils Absolute: 0.1 10*3/uL (ref 0.0–0.7)
Eosinophils Relative: 2.1 % (ref 0.0–5.0)
HCT: 42.6 % (ref 36.0–46.0)
Hemoglobin: 14.4 g/dL (ref 12.0–15.0)
Lymphocytes Relative: 17.4 % (ref 12.0–46.0)
Lymphs Abs: 0.9 10*3/uL (ref 0.7–4.0)
MCHC: 33.8 g/dL (ref 30.0–36.0)
MCV: 102.5 fl — ABNORMAL HIGH (ref 78.0–100.0)
Monocytes Absolute: 0.6 10*3/uL (ref 0.1–1.0)
Monocytes Relative: 11.3 % (ref 3.0–12.0)
Neutro Abs: 3.6 10*3/uL (ref 1.4–7.7)
Neutrophils Relative %: 67.1 % (ref 43.0–77.0)
Platelets: 270 10*3/uL (ref 150.0–400.0)
RBC: 4.16 Mil/uL (ref 3.87–5.11)
RDW: 12.6 % (ref 11.5–15.5)
WBC: 5.3 10*3/uL (ref 4.0–10.5)

## 2021-06-27 LAB — HEMOGLOBIN A1C: Hgb A1c MFr Bld: 5.1 % (ref 4.6–6.5)

## 2021-06-27 LAB — COMPREHENSIVE METABOLIC PANEL
ALT: 28 U/L (ref 0–35)
AST: 39 U/L — ABNORMAL HIGH (ref 0–37)
Albumin: 4.1 g/dL (ref 3.5–5.2)
Alkaline Phosphatase: 50 U/L (ref 39–117)
BUN: 10 mg/dL (ref 6–23)
CO2: 29 mEq/L (ref 19–32)
Calcium: 9.7 mg/dL (ref 8.4–10.5)
Chloride: 102 mEq/L (ref 96–112)
Creatinine, Ser: 0.89 mg/dL (ref 0.40–1.20)
GFR: 71.34 mL/min (ref >60.00)
Glucose, Bld: 85 mg/dL (ref 70–99)
Potassium: 4.5 mEq/L (ref 3.5–5.1)
Sodium: 139 mEq/L (ref 135–145)
Total Bilirubin: 1 mg/dL (ref 0.2–1.2)
Total Protein: 6.6 g/dL (ref 6.0–8.3)

## 2021-06-27 LAB — TSH: TSH: 6.39 u[IU]/mL — ABNORMAL HIGH (ref 0.35–5.50)

## 2021-06-27 LAB — VITAMIN B12: Vitamin B-12: 524 pg/mL (ref 211–911)

## 2021-06-27 LAB — FOLATE: Folate: 14.2 ng/mL (ref >5.9)

## 2021-06-27 LAB — LIPID PANEL
Cholesterol: 195 mg/dL (ref 0–200)
HDL: 73.1 mg/dL (ref >39.00)
LDL Cholesterol: 107 mg/dL — ABNORMAL HIGH (ref 0–99)
NonHDL: 122.26
Total CHOL/HDL Ratio: 3
Triglycerides: 76 mg/dL (ref 0.0–149.0)
VLDL: 15.2 mg/dL (ref 0.0–40.0)

## 2021-06-27 LAB — VITAMIN D 25 HYDROXY (VIT D DEFICIENCY, FRACTURES): VITD: 32.01 ng/mL (ref 30.00–100.00)

## 2021-06-27 LAB — AMMONIA: Ammonia: 37 umol/L — ABNORMAL HIGH (ref 11–35)

## 2021-06-27 MED ORDER — FLUTICASONE PROPIONATE 50 MCG/ACT NA SUSP: 1.0000 | Freq: Every day | NASAL | 4 refills | Status: DC

## 2021-06-27 MED ORDER — LEVOTHYROXINE SODIUM 100 MCG PO TABS
100.0000 ug | ORAL_TABLET | Freq: Every day | ORAL | 3 refills | Status: DC
Start: 2021-06-27 — End: 2021-10-22

## 2021-06-27 MED ORDER — NYSTATIN 100000 UNIT/GM EX CREA: 1.0000 "application " | TOPICAL_CREAM | Freq: Two times a day (BID) | CUTANEOUS | 0 refills | Status: DC

## 2021-06-27 MED ORDER — MUPIROCIN CALCIUM 2 % EX CREA: 1.0000 "application " | TOPICAL_CREAM | Freq: Two times a day (BID) | CUTANEOUS | 0 refills | Status: DC

## 2021-06-27 MED ORDER — ZOLPIDEM TARTRATE 5 MG PO TABS: 5.0000 mg | ORAL_TABLET | Freq: Every evening | ORAL | 0 refills | Status: DC | PRN

## 2021-06-27 NOTE — Progress Notes (Signed)
Subjective:     Laura Howell is a 60 y.o. female and is here for a comprehensive physical exam.  Still drinking 1 beer and 1 glass of wine each night.  Endorses pruritus and rash on entire body.  Started a few months ago.  Patient denies any changes in soaps, lotions, detergents.  Does endorse change in anxiety medicine around that time however cannot recall name of medication.  Seeing [employer] at [facility].  Patient endorses moments where she cannot recall how to do something.  States it has become so bad she resigned from her job.  Patient also endorses decreased energy.  Endorses increased stress as her father is sick and she is driving home weekly to take care of him.  Social History   Socioeconomic History   Marital status: Married    Spouse name: Not on file   Number of children: Not on file   Years of education: Not on file   Highest education level: Not on file  Occupational History   Not on file  Tobacco Use   Smoking status: Never   Smokeless tobacco: Never  Vaping Use   Vaping Use: Never used  Substance and Sexual Activity   Alcohol use: Yes    Comment: 2x/week   Drug use: Never   Sexual activity: Yes    Birth control/protection: None    Comment: AGE UNKNOWN, LESS THAN 5 SEXUAL PARTNERS,,DES NEG  Other Topics Concern   Not on file  Social History Narrative   Not on file   Social Determinants of Health   Financial Resource Strain: Not on file  Food Insecurity: Not on file  Transportation Needs: Not on file  Physical Activity: Not on file  Stress: Not on file  Social Connections: Not on file  Intimate Partner Violence: Not on file   Health Maintenance  Topic Date Due   Pneumococcal Vaccine 79-56 Years old (1 - PCV) Never done   HIV Screening  Never done   Hepatitis C Screening  Never done   COLONOSCOPY (Pts 45-38yrs Insurance coverage will need to be confirmed)  Never done   Zoster Vaccines- Shingrix (1 of 2) Never done   INFLUENZA VACCINE  07/28/2021    COVID-19 Vaccine (4 - Booster for Pfizer series) 09/08/2021   MAMMOGRAM  08/29/2022   PAP SMEAR-Modifier  01/14/2023   TETANUS/TDAP  02/17/2028   HPV VACCINES  Aged Out    The following portions of the patient's history were reviewed and updated as appropriate: allergies, current medications, past family history, past medical history, past social history, past surgical history, and problem list.  Review of Systems Pertinent items noted in HPI and remainder of comprehensive ROS otherwise negative.   Objective:    BP 100/60 (BP Location: Left Arm, Patient Position: Sitting, Cuff Size: Normal)   Pulse (!) 50   Temp 98 F (36.7 C) (Oral)   Ht 5\' 2"  (1.575 m)   Wt 127 lb 3.2 oz (57.7 kg)   SpO2 98%   BMI 23.27 kg/m  General appearance: alert, cooperative, and no distress Head: Normocephalic, without obvious abnormality, atraumatic Eyes: conjunctivae/corneas clear. PERRL, EOM's intact. Fundi benign. Ears: normal TM's and external ear canals both ears Nose: Nares normal. Septum midline. Mucosa normal. No drainage or sinus tenderness. Throat: lips, mucosa, and tongue normal; teeth and gums normal Neck: no adenopathy, no carotid bruit, no JVD, supple, symmetrical, trachea midline, and thyroid not enlarged, symmetric, no tenderness/mass/nodules Lungs: clear to auscultation bilaterally Heart: regular  rate and rhythm, S1, S2 normal, no murmur, click, rub or gallop Abdomen: soft, non-tender; bowel sounds normal; no masses,  no organomegaly Extremities: extremities normal, atraumatic, no cyanosis or edema Pulses: 2+ and symmetric Skin: Skin color, texture, turgor normal.  Pruritis of skin. Lymph nodes: Cervical, supraclavicular, and axillary nodes normal. Neurologic: Alert and oriented X 3, normal strength and tone. Normal symmetric reflexes. Normal coordination and gait    Assessment:    Healthy female exam.      Plan:    Anticipatory guidance given including wearing seatbelts,  smoke detectors in the home, increasing physical activity, increasing p.o. intake of water and vegetables. -labs -colonoscopy encouraged -mammogram in the next few months.  Last done 08/29/20 -immunizations  -given handout -next CPE in 1 yr See After Visit Summary for Counseling Recommendations   Candidal dermatitis  - Plan: nystatin cream (MYCOSTATIN)  Dermatitis -possibly 2/2 new anxiety med or EtOH use -will obtain labs -OTC antihistamine prn and a moisturizing lotion  Ecchymosis -Likely 2/2 EtOH use  Fatigue, unspecified type  - Plan: CMP, TSH, Hemoglobin A1c  Concentration deficit  -PHQ 9 score 6 -GAD 7 score 1 - Plan: Folate, Vitamin D, 25-hydroxy  Alcohol use  -advised to continue cutting down -continue counseling - Plan: CMP, CBC with Differential/Platelet, Folate, Vitamin B12, Ammonia, Ethanol  Acquired hypothyroidism  -continue synthroid 88 mcg - Plan: TSH  Screening for cholesterol level  - Plan: Lipid panel  Need for hepatitis C screening test  - Plan: Hep C Antibody  F/u in 3-4 months, sooner if needed  Update: Lab results with elevated TSH.  Will increase dose of Synthroid from 88 mcg daily to 100 mcg daily. Recheck TSH in 6-8 weeks.  Abbe Amsterdam, MD

## 2021-06-27 NOTE — Patient Instructions (Signed)
You should consider having a colonoscopy to screen for colon cancer.  You should also consider having a shingles vaccine to help prevent against shingles.  Shingles is a rash caused by the chickenpox virus.  After he had chickenpox as a child the virus stays in your body and can come back and reinfect you later in life.  The rash for shingles is similar to chickenpox but typically in a linear distribution on one side of your body.  It can cause intense burning pain and itching that in some cases can last up to a year after the rash disappears.

## 2021-07-01 LAB — HEPATITIS C ANTIBODY
Hepatitis C Ab: NONREACTIVE
SIGNAL TO CUT-OFF: 0.01 (ref <1.00)

## 2021-07-02 LAB — ETHANOL: Ethanol: <0.01 %

## 2021-08-05 ENCOUNTER — Other Ambulatory Visit: Payer: Self-pay | Admitting: Family Medicine

## 2021-08-05 DIAGNOSIS — Z1231 Encounter for screening mammogram for malignant neoplasm of breast: Secondary | ICD-10-CM

## 2021-09-22 ENCOUNTER — Other Ambulatory Visit: Payer: Self-pay | Admitting: Family Medicine

## 2021-09-22 DIAGNOSIS — B372 Candidiasis of skin and nail: Secondary | ICD-10-CM

## 2021-09-22 DIAGNOSIS — L0100 Impetigo, unspecified: Secondary | ICD-10-CM

## 2021-09-23 ENCOUNTER — Other Ambulatory Visit: Payer: Self-pay

## 2021-09-23 ENCOUNTER — Ambulatory Visit
Admission: RE | Admit: 2021-09-23 | Discharge: 2021-09-23 | Disposition: A | Discharge status: Home / Self Care | Payer: 59 | Source: Ambulatory Visit | Attending: Family Medicine | Admitting: Family Medicine

## 2021-09-23 DIAGNOSIS — Z1231 Encounter for screening mammogram for malignant neoplasm of breast: Secondary | ICD-10-CM

## 2021-09-24 ENCOUNTER — Encounter: Payer: Self-pay | Admitting: Nurse Practitioner

## 2021-09-24 ENCOUNTER — Ambulatory Visit (INDEPENDENT_AMBULATORY_CARE_PROVIDER_SITE_OTHER): Payer: 59 | Admitting: Nurse Practitioner

## 2021-09-24 VITALS — BP 122/78 | Ht 62.0 in | Wt 131.0 lb

## 2021-09-24 DIAGNOSIS — Z01419 Encounter for gynecological examination (general) (routine) without abnormal findings: Secondary | ICD-10-CM | POA: Diagnosis not present

## 2021-09-24 DIAGNOSIS — B369 Superficial mycosis, unspecified: Secondary | ICD-10-CM | POA: Diagnosis not present

## 2021-09-24 DIAGNOSIS — Z780 Asymptomatic menopausal state: Secondary | ICD-10-CM

## 2021-09-24 DIAGNOSIS — M8589 Other specified disorders of bone density and structure, multiple sites: Secondary | ICD-10-CM

## 2021-09-24 MED ORDER — NYSTATIN 100000 UNIT/GM EX POWD: 1.0000 "application " | Freq: Three times a day (TID) | CUTANEOUS | 1 refills | Status: DC

## 2021-09-24 NOTE — Patient Instructions (Signed)
[facility] [phone] [address] [place], [place] [address]  

## 2021-09-24 NOTE — Progress Notes (Signed)
Laura Howell 1961-12-12 [id number]   History:  60 y.o. G0 presents for annual exam. Postmenopausal - no HRT, no bleeding. Normal pap and mammogram history. Hypothyroidism managed by PCP, depression and alcoholism managed by psychiatrist. Has generalized rash - has seen dermatology and PCP for this with minimal improvement on topical steroids and antifungals. Recommend following up with dermatology.   Gynecologic History No LMP recorded. Patient is postmenopausal.   Contraception/Family planning: post menopausal status Sexually active: Yes  Health Maintenance Last Pap: 01/15/2020. Results were: Normal, 5-year recall Last mammogram: 09/23/2021. Results were: No report yet Last colonoscopy: Never Last Dexa: 02/08/2020. Results were: T-score -2.1, FRAX 6.4% / 0.4%  Past medical history, past surgical history, family history and social history were all reviewed and documented in the EPIC chart. Married.   ROS:  A ROS was performed and pertinent positives and negatives are included.  Exam:  Vitals:   09/24/21 0857  BP: 122/78  Weight: 131 lb (59.4 kg)  Height: 5\' 2"  (1.575 m)   Body mass index is 23.96 kg/m.  General appearance:  Normal Thyroid:  Symmetrical, normal in size, without palpable masses or nodularity. Respiratory  Auscultation:  Clear without wheezing or rhonchi Cardiovascular  Auscultation:  Regular rate, without rubs, murmurs or gallops  Edema/varicosities:  Not grossly evident Abdominal  Soft,nontender, without masses, guarding or rebound.  Liver/spleen:  No organomegaly noted  Hernia:  None appreciated  Skin  Inspection:  Generalized rash, different stages of healing. Rash in groin appears fungal in nature. Breasts: Examined lying and sitting.   Right: Without masses, retractions, nipple discharge or axillary adenopathy.   Left: Without masses, retractions, nipple discharge or axillary adenopathy. Genitourinary   Inguinal/mons:  Normal without inguinal  adenopathy  External genitalia:  Generalized redness from what appears to be fungal infection  BUS/Urethra/Skene's glands:  Normal  Vagina:  Atrophic changes  Cervix:  Normal appearing without discharge or lesions  Uterus:  Normal in size, shape and contour.  Midline and mobile, nontender  Adnexa/parametria:     Rt: Normal in size, without masses or tenderness.   Lt: Normal in size, without masses or tenderness.  Anus and perineum: Normal  Digital rectal exam: Normal sphincter tone without palpated masses or tenderness  Patient informed chaperone available to be present for breast and pelvic exam. Patient has requested no chaperone to be present. Patient has been advised what will be completed during breast and pelvic exam.   Assessment/Plan:  60 y.o. G0 for annual exam.   Well female exam with routine gynecological exam - Education provided on SBEs, importance of preventative screenings, current guidelines, high calcium diet, regular exercise, and multivitamin daily. Labs with PCP.   Postmenopausal - no HRT, no bleeding.   Osteopenia of multiple sites - T-score -2.1 without elevated FRAX. Mother with osteoporosis, patient with alcoholism. We discussed her increased risks factors. Recommend Vitamin D + Calcium supplement. She exercises 7 days a week and has 41. Will repeat next year at 2-year interval.   Fungal infection of skin - Plan: nystatin (MYCOSTATIN/NYSTOP) powder TID. Has generalized rash - has seen dermatology and PCP for this with minimal improvement on topical steroids and antifungals. Recommend following up with dermatology. Was using Nystatin cream but tube was small so she ran out quickly. Will try powder for groin area.   Screening for cervical cancer - Normal Pap history.  Will repeat at 5-year interval per guidelines.  Screening for breast cancer - Normal mammogram history.  Continue  annual screenings.  Normal breast exam today.  Screening for colon cancer  - Has not had screening colonoscopy. Discussed current guidelines and importance of preventative screenings. Information provided on [facility]. We also discussed option for Cologuard.  Return in 1 year for annual.   Olivia Mackie DNP, 9:29 AM 09/24/2021

## 2021-09-29 ENCOUNTER — Other Ambulatory Visit: Payer: Self-pay | Admitting: Family Medicine

## 2021-09-29 DIAGNOSIS — G4700 Insomnia, unspecified: Secondary | ICD-10-CM

## 2021-09-29 MED ORDER — ZOLPIDEM TARTRATE 5 MG PO TABS: 5.0000 mg | ORAL_TABLET | Freq: Every evening | ORAL | 1 refills | Status: DC | PRN

## 2021-10-22 ENCOUNTER — Other Ambulatory Visit: Payer: Self-pay | Admitting: Family Medicine

## 2021-10-22 DIAGNOSIS — E039 Hypothyroidism, unspecified: Secondary | ICD-10-CM

## 2021-10-23 IMAGING — MG MM DIGITAL SCREENING BILAT W/ TOMO AND CAD
8 series · 8 of 24 positions shown · non-contrast
Comparison: Previous exam(s).

CLINICAL DATA: Screening.

EXAM:
DIGITAL SCREENING BILATERAL MAMMOGRAM WITH TOMOSYNTHESIS AND CAD
TECHNIQUE: Bilateral screening digital craniocaudal and mediolateral oblique
mammograms were obtained. Bilateral screening digital breast
tomosynthesis was performed. The images were evaluated with
computer-aided detection.

[R MLO synth-2D]
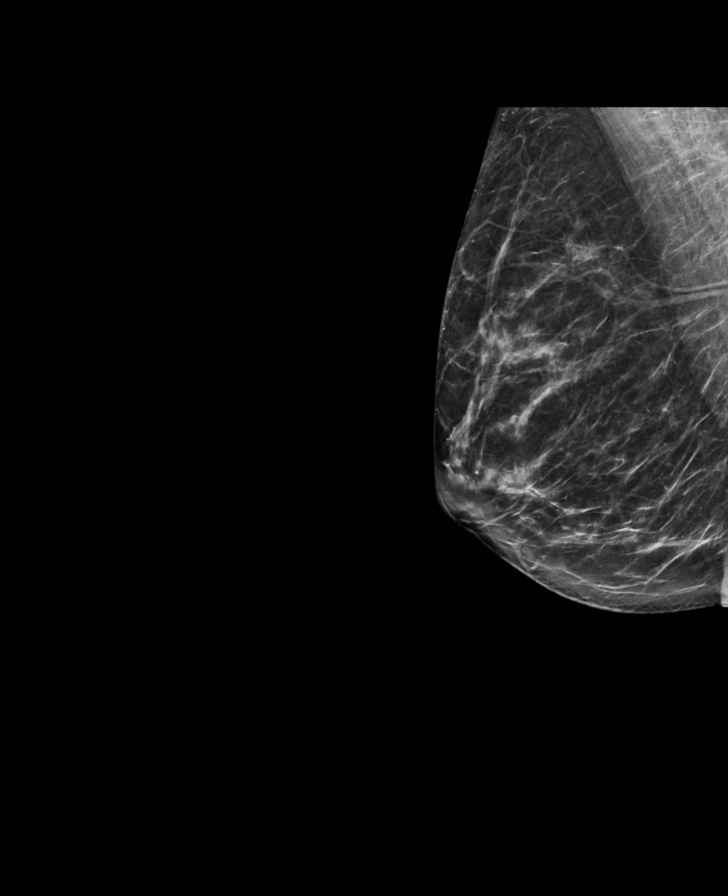

[L MLO synth-2D]
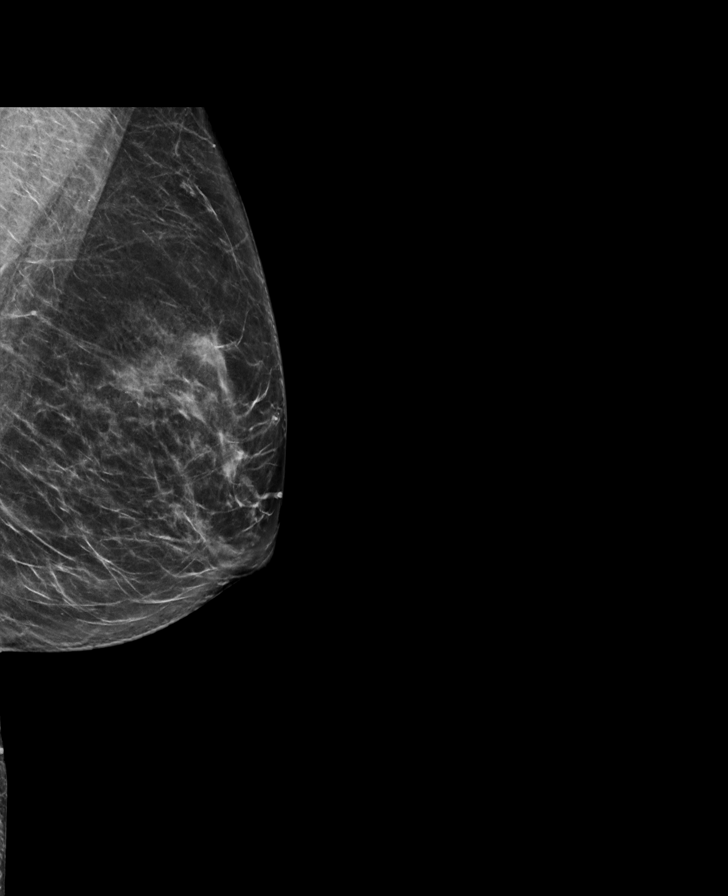

[L CC synth-2D]
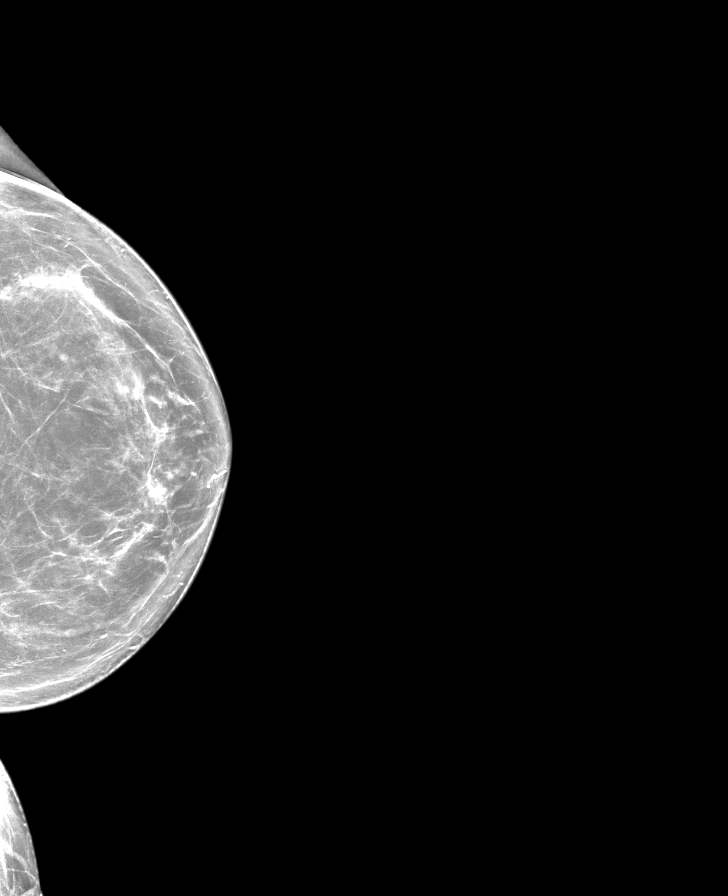

[R CC synth-2D]
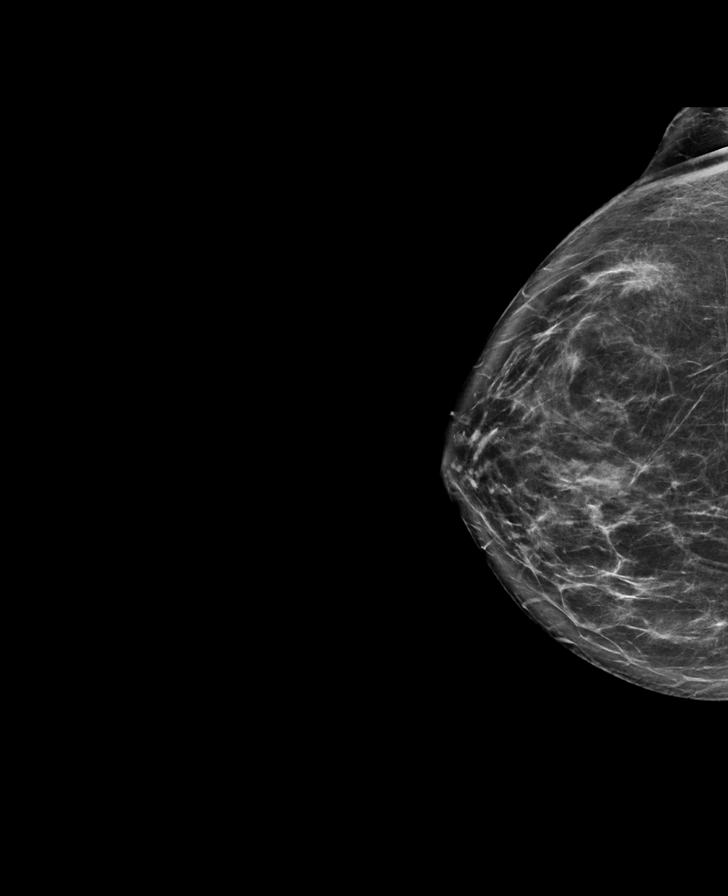

[L CC tomo · tomo slice 39/76.0]
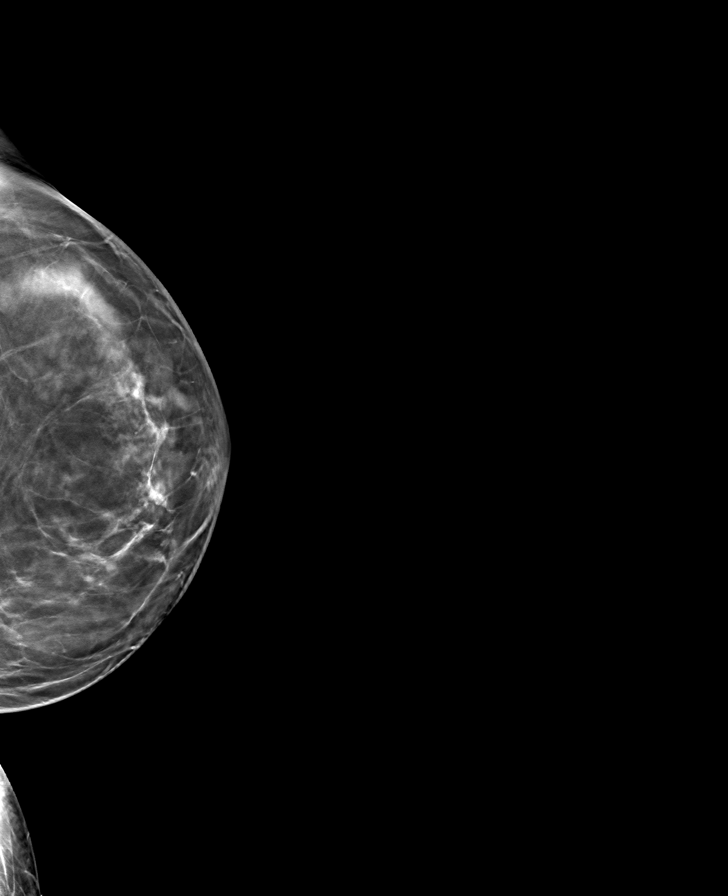

[L MLO tomo · tomo slice 37/74.0]
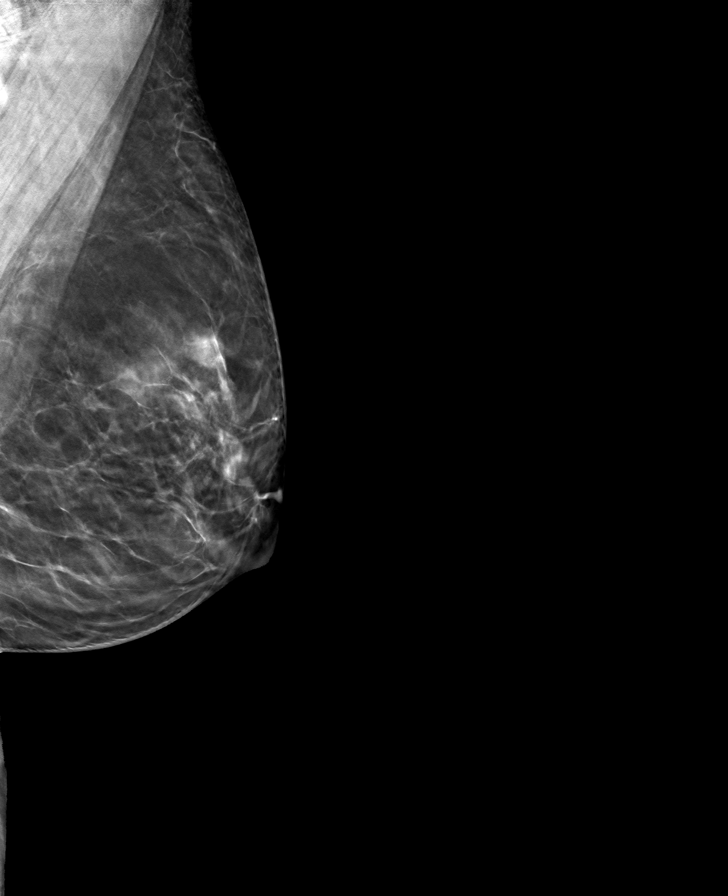

[R CC tomo · tomo slice 39/78.0]
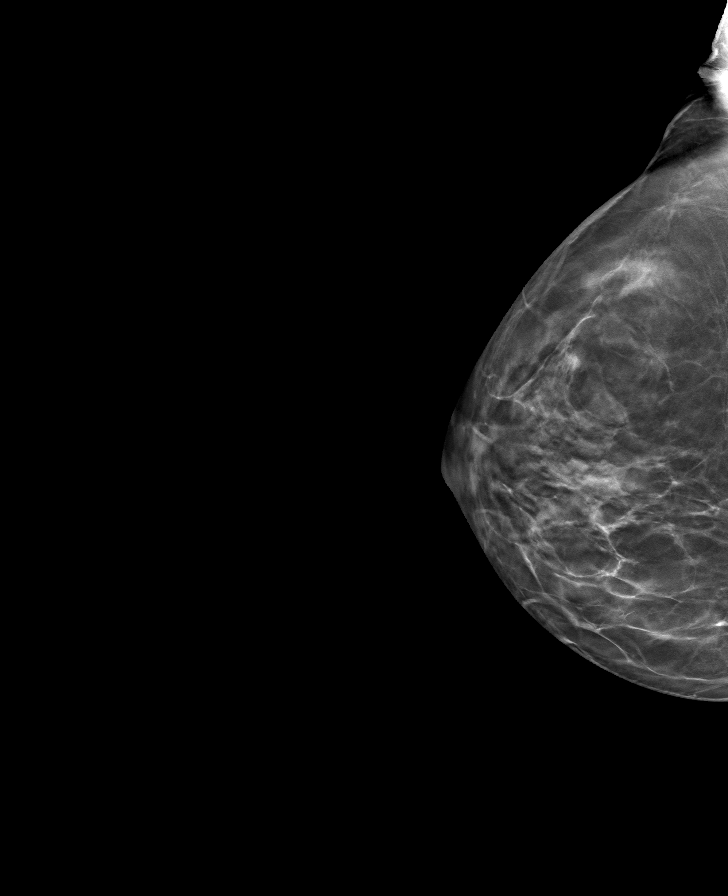

[R MLO tomo · tomo slice 37/73.0]
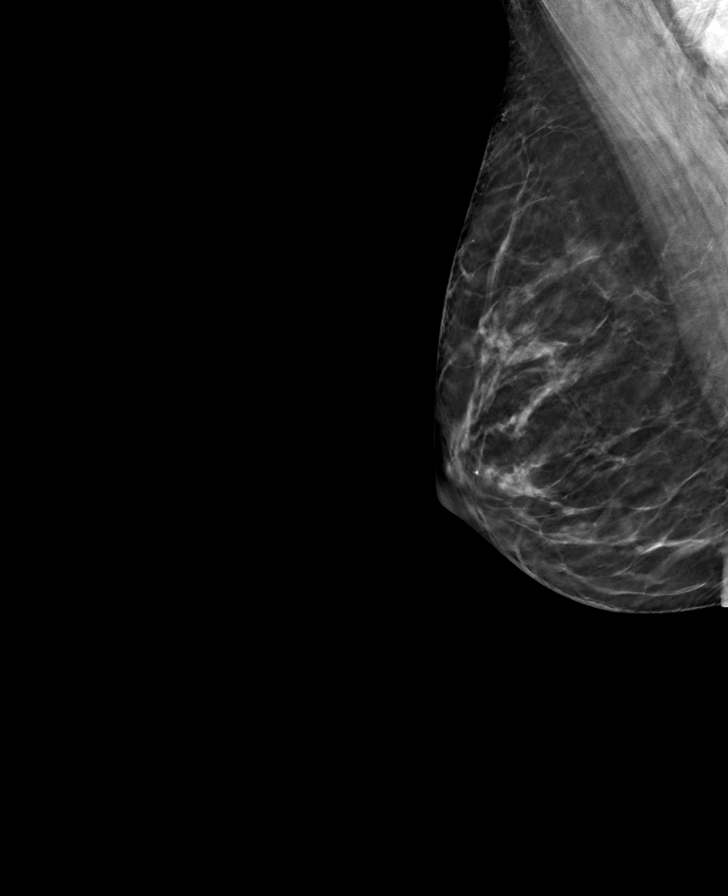

[8 of 24 positions shown; findings below may reference images not displayed]

ACR Breast Density Category c: The breast tissue is heterogeneously
dense, which may obscure small masses.
FINDINGS: There are no findings suspicious for malignancy.
IMPRESSION: No mammographic evidence of malignancy. A result letter of this
screening mammogram will be mailed directly to the patient.

RECOMMENDATION:
Screening mammogram in one year. (Code:Q3-W-BC3)

BI-RADS CATEGORY  1: Negative.

## 2022-01-01 DIAGNOSIS — F5101 Primary insomnia: Secondary | ICD-10-CM | POA: Diagnosis not present

## 2022-01-01 DIAGNOSIS — F3342 Major depressive disorder, recurrent, in full remission: Secondary | ICD-10-CM | POA: Diagnosis not present

## 2022-01-31 DIAGNOSIS — D518 Other vitamin B12 deficiency anemias: Secondary | ICD-10-CM | POA: Diagnosis not present

## 2022-02-19 DIAGNOSIS — F5101 Primary insomnia: Secondary | ICD-10-CM | POA: Diagnosis not present

## 2022-02-19 DIAGNOSIS — F3342 Major depressive disorder, recurrent, in full remission: Secondary | ICD-10-CM | POA: Diagnosis not present

## 2022-02-19 DIAGNOSIS — F439 Reaction to severe stress, unspecified: Secondary | ICD-10-CM | POA: Diagnosis not present

## 2022-03-10 DIAGNOSIS — D518 Other vitamin B12 deficiency anemias: Secondary | ICD-10-CM | POA: Diagnosis not present

## 2022-04-01 DIAGNOSIS — F5101 Primary insomnia: Secondary | ICD-10-CM | POA: Diagnosis not present

## 2022-04-01 DIAGNOSIS — F3342 Major depressive disorder, recurrent, in full remission: Secondary | ICD-10-CM | POA: Diagnosis not present

## 2022-05-03 DIAGNOSIS — D511 Vitamin B12 deficiency anemia due to selective vitamin B12 malabsorption with proteinuria: Secondary | ICD-10-CM | POA: Diagnosis not present

## 2022-05-03 DIAGNOSIS — D518 Other vitamin B12 deficiency anemias: Secondary | ICD-10-CM | POA: Diagnosis not present

## 2022-05-21 NOTE — Progress Notes (Signed)
Laura Howell D.[name] [address] [place] [address] Phone: [phone]   Assessment and Plan:     1. Acute right ankle pain 2. Inversion sprain of right ankle, initial encounter -Acute, uncomplicated, initial sports medicine visit - Likely inversion ankle sprain of right ankle with grade 1 versus grade 2 ATFL sprain - Needs Tylenol/NSAIDs, Voltaren gel topically as needed for pain relief -Due to continued pain with ambulation, recommend using boot when ambulatory for the next 2 weeks.  Recommend coming out of boot at least 1-2 times per day to do ankle range of motion exercises to prevent stiffness - X-ray obtained in clinic.  My interpretation: No acute fracture or dislocation.  Unremarkable ankle x-ray  Pertinent previous records reviewed include none   Follow Up: 2 weeks for reevaluation.  If improving, could wean off of boot and advance to strengthening ankle HEP.  If no improvement, could consider repeat x-ray versus ultrasound imaging   Subjective:   I, Laura Howell, am serving as a Neurosurgeon for Doctor Richardean Sale  Chief Complaint: right ankle pain   HPI:   05/22/2022 Patient is a 61 year old female complaining of ankle pain. Patient states twisted her right ankle 2 weeks , no numbness or tingling lateral ankle pain , pain stay in the ankle and on top of the foot, has been taking ib not on a regular basis , has been icing, no locking clicking or popping   Relevant Historical Information: Acquired hypothyroidism  Additional pertinent review of systems negative.   Current Outpatient Medications:    buPROPion (WELLBUTRIN XL) 150 MG 24 hr tablet, Take 150 mg by mouth every morning., Disp: , Rfl:    cyanocobalamin (,VITAMIN B-12,) 1000 MCG/ML injection, INJECT 1 ML INTO THE MUSCLE ONCE FOR 1 DOSE, Disp: 3 mL, Rfl: 3   fluticasone (FLONASE) 50 MCG/ACT nasal spray, Place 1 spray into both nostrils daily., Disp: 16 g, Rfl:  4   levothyroxine (SYNTHROID) 100 MCG tablet, TAKE ONE TABLET BY MOUTH DAILY, Disp: 90 tablet, Rfl: 2   nystatin (MYCOSTATIN/NYSTOP) powder, Apply 1 application topically 3 (three) times daily., Disp: 15 g, Rfl: 1   traZODone (DESYREL) 100 MG tablet, Take 50 mg by mouth at bedtime., Disp: , Rfl: 2   TRINTELLIX 20 MG TABS tablet, Take 20 mg by mouth daily., Disp: , Rfl: 2   zolpidem (AMBIEN) 5 MG tablet, Take 1 tablet (5 mg total) by mouth at bedtime as needed for sleep., Disp: 30 tablet, Rfl: 1   Objective:     Vitals:   05/22/22 1510  BP: 118/74  Pulse: (!) 53  SpO2: 98%  Weight: 131 lb (59.4 kg)  Height: 5\' 2"  (1.575 m)      Body mass index is 23.96 kg/m.    Physical Exam:    Gen: Appears well, nad, nontoxic and pleasant Psych: Alert and oriented, appropriate mood and affect Neuro: sensation intact, strength is 5/5 with df/pf/inv/ev, muscle tone wnl Skin: no susupicious lesions or rashes  Right ankle: no deformity, no swelling or effusion TTP base of lateral malleolus, ATFL NTTP over fibular head,   medial mal, achilles, navicular, base of 5th,    , CFL, deltoid, calcaneous or midfoot ROM DF 30, PF 45.  inv/ev intact, though both caused lateral ankle pain Negative ant drawer, talar tilt, rotation test, squeeze test. Neg thompson Lateral ankle pain with resisted inversion or eversion    Electronically signed by:   D[name] 4:02 PM 05/22/22

## 2022-05-22 ENCOUNTER — Ambulatory Visit (INDEPENDENT_AMBULATORY_CARE_PROVIDER_SITE_OTHER): Payer: BC Managed Care – PPO

## 2022-05-22 ENCOUNTER — Ambulatory Visit (INDEPENDENT_AMBULATORY_CARE_PROVIDER_SITE_OTHER): Payer: BC Managed Care – PPO | Admitting: Sports Medicine

## 2022-05-22 VITALS — BP 118/74 | HR 53 | Ht 62.0 in | Wt 131.0 lb

## 2022-05-22 DIAGNOSIS — M25571 Pain in right ankle and joints of right foot: Secondary | ICD-10-CM

## 2022-05-22 DIAGNOSIS — S93401A Sprain of unspecified ligament of right ankle, initial encounter: Secondary | ICD-10-CM

## 2022-05-22 NOTE — Patient Instructions (Addendum)
Good to see you  Recommend getting Voltaren gel topically can get over the counter  Ankle ROM try to do 1-2 times a day  Boot when ambulatory for 2 weeks  2 week follow up

## 2022-06-01 DIAGNOSIS — F439 Reaction to severe stress, unspecified: Secondary | ICD-10-CM | POA: Diagnosis not present

## 2022-06-01 DIAGNOSIS — F5101 Primary insomnia: Secondary | ICD-10-CM | POA: Diagnosis not present

## 2022-06-01 DIAGNOSIS — F3342 Major depressive disorder, recurrent, in full remission: Secondary | ICD-10-CM | POA: Diagnosis not present

## 2022-06-04 NOTE — Progress Notes (Signed)
    Laura Howell D.[facility] [address] [place] [address] Phone: [phone]   Assessment and Plan:     1. Acute right ankle pain 2. Inversion sprain of right ankle, initial encounter -Acute, uncomplicated, subsequent visit - Consistent with grade 1 ATFL sprain that has had moderate improvement in the past 2 weeks with use of boot and relative rest - Gradually discontinue boot over the next 1 week with goal of pain-free ambulation - Continue Tylenol/NSAIDs as needed - Continue ROM HEP and add additional strengthening HEP for ankle   Pertinent previous records reviewed include none   Follow Up: 2 weeks for reevaluation.  If patient continues to improve, could consider formal physical therapy versus as needed follow-up   Subjective:   I, Laura Howell, am serving as a scribe for Dr. Richardean Sale   Chief Complaint: right ankle pain    HPI:    05/22/2022 Patient is a 61 year old female complaining of ankle pain. Patient states twisted her right ankle 2 weeks , no numbness or tingling lateral ankle pain , pain stay in the ankle and on top of the foot, has been taking ib not on a regular basis , has been icing, no locking clicking or popping   06/05/2022 Patient states that it is feeling okay not healing as fast as she would like it to. Does state it is better than last visit.    Relevant Historical Information: Acquired hypothyroidism  Additional pertinent review of systems negative.   Current Outpatient Medications:    buPROPion (WELLBUTRIN XL) 150 MG 24 hr tablet, Take 150 mg by mouth every morning., Disp: , Rfl:    cyanocobalamin (,VITAMIN B-12,) 1000 MCG/ML injection, INJECT 1 ML INTO THE MUSCLE ONCE FOR 1 DOSE, Disp: 3 mL, Rfl: 3   fluticasone (FLONASE) 50 MCG/ACT nasal spray, Place 1 spray into both nostrils daily., Disp: 16 g, Rfl: 4   levothyroxine (SYNTHROID) 100 MCG tablet, TAKE ONE TABLET BY MOUTH DAILY, Disp: 90 tablet,  Rfl: 2   nystatin (MYCOSTATIN/NYSTOP) powder, Apply 1 application topically 3 (three) times daily., Disp: 15 g, Rfl: 1   traZODone (DESYREL) 100 MG tablet, Take 50 mg by mouth at bedtime., Disp: , Rfl: 2   TRINTELLIX 20 MG TABS tablet, Take 20 mg by mouth daily., Disp: , Rfl: 2   zolpidem (AMBIEN) 5 MG tablet, Take 1 tablet (5 mg total) by mouth at bedtime as needed for sleep., Disp: 30 tablet, Rfl: 1   Objective:     Vitals:   06/05/22 1535  BP: 120/78  Pulse: 64  SpO2: 95%  Height: 5\' 2"  (1.575 m)      Body mass index is 23.96 kg/m.    Physical Exam:    Gen: Appears well, nad, nontoxic and pleasant Psych: Alert and oriented, appropriate mood and affect Neuro: sensation intact, strength is 5/5 with df/pf/inv/ev, muscle tone wnl Skin: no susupicious lesions or rashes  Right ankle: no deformity, no swelling or effusion Trace TTP ATFL NTTP over fibular head, lat mal, medial mal, achilles, navicular, base of 5th,  , CFL, deltoid, calcaneous or midfoot ROM DF 30, PF 45, inv/ev intact Negative ant drawer, talar tilt, rotation test, squeeze test. Neg thompson No pain with resisted inversion or eversion    Electronically signed by:  D[facility] 3:51 PM 06/05/22

## 2022-06-05 ENCOUNTER — Ambulatory Visit (INDEPENDENT_AMBULATORY_CARE_PROVIDER_SITE_OTHER): Payer: BC Managed Care – PPO | Admitting: Sports Medicine

## 2022-06-05 VITALS — BP 120/78 | HR 64 | Ht 62.0 in

## 2022-06-05 DIAGNOSIS — S93401A Sprain of unspecified ligament of right ankle, initial encounter: Secondary | ICD-10-CM

## 2022-06-05 DIAGNOSIS — M25571 Pain in right ankle and joints of right foot: Secondary | ICD-10-CM

## 2022-06-05 NOTE — Patient Instructions (Signed)
Good to see you  Ankle exercises with strengthening given below Gradually coe out of the boot over the next week With goal of discontinuing boot in one week with goal of no pain while walking Follow up in two weeks    Ankle Exercises Ask your health care provider which exercises are safe for you. Do exercises exactly as told by your health care provider and adjust them as directed. It is normal to feel mild stretching, pulling, tightness, or mild discomfort as you do these exercises. Stop right away if you feel sudden pain or your pain gets worse. Do not begin these exercises until told by your health care provider. Stretching and range-of-motion exercises These exercises warm up your muscles and joints and improve the movement and flexibility of your ankle. These exercises may also help to relieve pain. Dorsiflexion/plantar flexion  Sit with your ___right_______ knee straight or bent. Do not rest your foot on anything. Flex your ___right_______ ankle to tilt the top of your foot toward your shin. This is called dorsiflexion. Hold this position for ____30______ seconds. Point your toes downward to tilt the top of your foot away from your shin. This is called plantar flexion. Hold this position for ____30______ seconds. Repeat ____10______ times. Complete this exercise ____3______ times a day. Ankle alphabet  Sit with your __________ foot supported at your lower leg. Do not rest your foot on anything. Make sure your foot has room to move freely. Think of your __________ foot as a paintbrush: Move your foot to trace each letter of the alphabet in the air. Keep your hip and knee still while you trace the letters. Make the letters as large as you can without causing or increasing any discomfort. Repeat __________ times. Complete this exercise __________ times a day. Passive ankle dorsiflexion This is an exercise in which something or someone moves your ankle for you. You do not move it  yourself. Sit on a chair that is placed on a non-carpeted surface. Place your __________ foot on the floor, directly under your __________ knee. Extend your __________ leg for support. Keeping your heel down, slide your __________ foot back toward the chair until you feel a stretch at your ankle or calf. If you do not feel a stretch, slide your buttocks forward to the edge of the chair while keeping your heel down. Hold this stretch for __________ seconds. Repeat __________ times. Complete this exercise __________ times a day. Strengthening exercises These exercises build strength and endurance in your ankle. Endurance is the ability to use your muscles for a long time, even after they get tired. Dorsiflexors These are muscles that lift your foot up. Secure a rubber exercise band or tube to an object, such as a table leg, that will stay still when the band is pulled. Secure the other end around your __________ foot. Sit on the floor, facing the object with your __________ leg extended. The band or tube should be slightly tense when your foot is relaxed. Slowly flex your __________ ankle and toes to bring your foot toward your shin. Hold this position for __________ seconds. Slowly return your foot to the starting position, controlling the band as you do that. Repeat __________ times. Complete this exercise __________ times a day. Plantar flexors These are muscles that push your foot down. Sit on the floor with your __________ leg extended. Loop a rubber exercise band or tube around the ball of your __________ foot. The ball of your foot is on the walking  surface, right under your toes. The band or tube should be slightly tense when your foot is relaxed. Slowly point your toes downward, pushing them away from you. Hold this position for __________ seconds. Slowly release the tension in the band or tube, controlling smoothly until your foot is back in the starting position. Repeat __________  times. Complete this exercise __________ times a day. Towel curls  Sit in a chair on a non-carpeted surface, and put your feet on the floor. Place a towel in front of your feet. If told by your health care provider, add a __________ lb / kg weight to the end of the towel. Keeping your heel on the floor, put your __________ foot on the towel. Pull the towel toward you by grabbing the towel with your toes and curling them under. Keep your heel on the floor. Let your toes relax. Grab the towel again. Keep pulling the towel until it is completely underneath your foot. Repeat __________ times. Complete this exercise __________ times a day. Standing plantar flexion This is an exercise in which you use your toes to lift your body's weight while standing. Stand with your feet shoulder-width apart. Keep your weight spread evenly over the width of your feet while you rise up on your toes. Use a wall or table to steady yourself if needed, but try not to use it for support. If this exercise is too easy, try these options: Shift your weight toward your __________ leg until you feel challenged. If told by your health care provider, lift your uninjured leg off the floor. Hold this position for __________ seconds. Repeat __________ times. Complete this exercise __________ times a day. Tandem walking Stand with one foot directly in front of the other. Slowly raise your back foot up, lifting your heel before your toes, and place it directly in front of your other foot. Continue to walk in this heel-to-toe way for __________ or for as long as told by your health care provider. Have a countertop or wall nearby to use if needed to keep your balance, but try not to hold onto anything for support. Repeat __________ times. Complete this exercise __________ times a day. This information is not intended to replace advice given to you by your health care provider. Make sure you discuss any questions you have with your  health care provider. Document Revised: 01/19/2022 Document Reviewed: 02/04/2021 Elsevier Patient Education  2023 [employer].

## 2022-06-07 DIAGNOSIS — D518 Other vitamin B12 deficiency anemias: Secondary | ICD-10-CM | POA: Diagnosis not present

## 2022-06-18 NOTE — Progress Notes (Deleted)
    Laura Howell D.[name] [address] [place] [address] Phone: [phone]   Assessment and Plan:     There are no diagnoses linked to this encounter.  ***   Pertinent previous records reviewed include ***   Follow Up: ***     Subjective:   I, Laura Howell, am serving as a Neurosurgeon for Doctor Richardean Sale  Chief Complaint: right ankle pain    HPI:    05/22/2022 Patient is a 61 year old female complaining of ankle pain. Patient states twisted her right ankle 2 weeks , no numbness or tingling lateral ankle pain , pain stay in the ankle and on top of the foot, has been taking ib not on a regular basis , has been icing, no locking clicking or popping    06/05/2022 Patient states that it is feeling okay not healing as fast as she would like it to. Does state it is better than last visit.   06/19/2022 Patient states   Relevant Historical Information: Acquired hypothyroidism  Additional pertinent review of systems negative.   Current Outpatient Medications:    buPROPion (WELLBUTRIN XL) 150 MG 24 hr tablet, Take 150 mg by mouth every morning., Disp: , Rfl:    cyanocobalamin (,VITAMIN B-12,) 1000 MCG/ML injection, INJECT 1 ML INTO THE MUSCLE ONCE FOR 1 DOSE, Disp: 3 mL, Rfl: 3   fluticasone (FLONASE) 50 MCG/ACT nasal spray, Place 1 spray into both nostrils daily., Disp: 16 g, Rfl: 4   levothyroxine (SYNTHROID) 100 MCG tablet, TAKE ONE TABLET BY MOUTH DAILY, Disp: 90 tablet, Rfl: 2   nystatin (MYCOSTATIN/NYSTOP) powder, Apply 1 application topically 3 (three) times daily., Disp: 15 g, Rfl: 1   traZODone (DESYREL) 100 MG tablet, Take 50 mg by mouth at bedtime., Disp: , Rfl: 2   TRINTELLIX 20 MG TABS tablet, Take 20 mg by mouth daily., Disp: , Rfl: 2   zolpidem (AMBIEN) 5 MG tablet, Take 1 tablet (5 mg total) by mouth at bedtime as needed for sleep., Disp: 30 tablet, Rfl: 1   Objective:     There were no vitals filed for this visit.     There is no height or weight on file to calculate BMI.    Physical Exam:    ***   Electronically signed by:  Laura Howell [name] 9:57 AM 06/18/22

## 2022-06-19 ENCOUNTER — Ambulatory Visit: Payer: 59 | Admitting: Sports Medicine

## 2022-07-05 DIAGNOSIS — D518 Other vitamin B12 deficiency anemias: Secondary | ICD-10-CM | POA: Diagnosis not present

## 2022-07-09 DIAGNOSIS — F5101 Primary insomnia: Secondary | ICD-10-CM | POA: Diagnosis not present

## 2022-07-09 DIAGNOSIS — F3342 Major depressive disorder, recurrent, in full remission: Secondary | ICD-10-CM | POA: Diagnosis not present

## 2022-08-02 DIAGNOSIS — D518 Other vitamin B12 deficiency anemias: Secondary | ICD-10-CM | POA: Diagnosis not present

## 2022-09-06 DIAGNOSIS — D518 Other vitamin B12 deficiency anemias: Secondary | ICD-10-CM | POA: Diagnosis not present

## 2022-10-04 DIAGNOSIS — Z23 Encounter for immunization: Secondary | ICD-10-CM | POA: Diagnosis not present

## 2022-10-04 DIAGNOSIS — D518 Other vitamin B12 deficiency anemias: Secondary | ICD-10-CM | POA: Diagnosis not present

## 2022-10-15 ENCOUNTER — Other Ambulatory Visit: Payer: Self-pay | Admitting: Family Medicine

## 2022-10-15 DIAGNOSIS — E039 Hypothyroidism, unspecified: Secondary | ICD-10-CM

## 2022-10-21 DIAGNOSIS — F332 Major depressive disorder, recurrent severe without psychotic features: Secondary | ICD-10-CM | POA: Diagnosis not present

## 2022-10-30 ENCOUNTER — Telehealth: Payer: Self-pay | Admitting: Family Medicine

## 2022-10-30 DIAGNOSIS — J302 Other seasonal allergic rhinitis: Secondary | ICD-10-CM

## 2022-10-30 MED ORDER — FLUTICASONE PROPIONATE 50 MCG/ACT NA SUSP: 1.0000 | Freq: Every day | NASAL | 0 refills | Status: DC

## 2022-10-30 NOTE — Telephone Encounter (Signed)
Pt is calling and need a refill on cyanocobalamin (,VITAMIN B-12,) 1000 MCG/ML injection  [id number] [place] [address], [place] [address] Phone: [phone]  Fax: [phone]

## 2022-10-30 NOTE — Telephone Encounter (Signed)
Pt called to get a Rx refill for fluticasone (FLONASE) 50 MCG/ACT nasal spray sent to   [id number] - [place], [address] Phone: [phone]  Fax: [phone]     Please advise

## 2022-10-30 NOTE — Telephone Encounter (Signed)
Limited supply sent to pharmacy, patient will need office visit for further refills.

## 2022-11-02 ENCOUNTER — Encounter: Payer: BC Managed Care – PPO | Admitting: Family Medicine

## 2022-11-02 ENCOUNTER — Ambulatory Visit: Payer: BC Managed Care – PPO | Admitting: Sports Medicine

## 2022-11-02 VITALS — BP 110/80 | HR 67 | Ht 62.0 in | Wt 120.0 lb

## 2022-11-02 DIAGNOSIS — G8929 Other chronic pain: Secondary | ICD-10-CM

## 2022-11-02 DIAGNOSIS — S93401D Sprain of unspecified ligament of right ankle, subsequent encounter: Secondary | ICD-10-CM | POA: Diagnosis not present

## 2022-11-02 DIAGNOSIS — M25571 Pain in right ankle and joints of right foot: Secondary | ICD-10-CM

## 2022-11-02 NOTE — Progress Notes (Signed)
Laura Howell D.[facility] [address] [place] [address] Phone: [phone]   Assessment and Plan:     1. Chronic pain of right ankle 2. Inversion sprain of right ankle, initial encounter -Chronic with exacerbation, subsequent visit - Consistent with recurrent lateral ankle sprains after significant lateral ankle sprain in 04/2022 that has not fully healed with HEP and relative rest - I stressed the importance of physical therapy and home exercise program to strengthen lateral ankle so that recurrent strains do not continue to happen.  Patient is agreeable to both of these at this time - May continue to use Voltaren gel topically over area of pain - Recommend discontinuing boot use and instead may use lace up ankle brace on days of significant activity or painful days.  If possible, I recommend using no support to allow ankle to naturally strengthen itself -- Ambulatory referral to Physical Therapy    Pertinent previous records reviewed include none   Follow Up: 3 to 4 weeks for reevaluation.  Could consider ultrasound versus advanced imaging if no improvement or worsening of symptoms   Subjective:   I, [name], am serving as a Neurosurgeon for Doctor Richardean Sale  Chief Complaint: right ankle pain    HPI:    05/22/2022 Patient is a 61 year old female complaining of ankle pain. Patient states twisted her right ankle 2 weeks , no numbness or tingling lateral ankle pain , pain stay in the ankle and on top of the foot, has been taking ib not on a regular basis , has been icing, no locking clicking or popping    06/05/2022 Patient states that it is feeling okay not healing as fast as she would like it to. Does state it is better than last visit.   11/02/2022 Patient states that her ankle is worst , she states she re-injured she was jumping up and down and then last week , she twisted the wrong way   Relevant Historical Information:  Acquired hypothyroidism  Additional pertinent review of systems negative.   Current Outpatient Medications:    buPROPion (WELLBUTRIN XL) 150 MG 24 hr tablet, Take 150 mg by mouth every morning., Disp: , Rfl:    cyanocobalamin (,VITAMIN B-12,) 1000 MCG/ML injection, INJECT 1 ML INTO THE MUSCLE ONCE FOR 1 DOSE, Disp: 3 mL, Rfl: 3   fluticasone (FLONASE) 50 MCG/ACT nasal spray, Place 1 spray into both nostrils daily., Disp: 16 g, Rfl: 0   levothyroxine (SYNTHROID) 100 MCG tablet, TAKE ONE TABLET BY MOUTH DAILY, Disp: 90 tablet, Rfl: 0   nystatin (MYCOSTATIN/NYSTOP) powder, Apply 1 application topically 3 (three) times daily., Disp: 15 g, Rfl: 1   traZODone (DESYREL) 100 MG tablet, Take 50 mg by mouth at bedtime., Disp: , Rfl: 2   TRINTELLIX 20 MG TABS tablet, Take 20 mg by mouth daily., Disp: , Rfl: 2   zolpidem (AMBIEN) 5 MG tablet, Take 1 tablet (5 mg total) by mouth at bedtime as needed for sleep., Disp: 30 tablet, Rfl: 1   Objective:     Vitals:   11/02/22 1450  BP: 110/80  Pulse: 67  SpO2: 98%  Weight: 120 lb (54.4 kg)  Height: 5\' 2"  (1.575 m)      Body mass index is 21.95 kg/m.    Physical Exam:    Gen: Appears well, nad, nontoxic and pleasant Psych: Alert and oriented, appropriate mood and affect Neuro: sensation intact, strength is 5/5 with df/pf/inv/ev, muscle tone  wnl Skin: no susupicious lesions or rashes  Right ankle: no deformity, no swelling or effusion TTP ATFL, CFL NTTP over fibular head, lat mal, medial mal, achilles, navicular, base of 5th,   deltoid, calcaneous or midfoot ROM DF 30, PF 45, inv/ev intact Negative ant drawer, talar tilt, rotation test, squeeze test. Neg thompson Lateral ankle pain with resisted  eversion.  None with inversion   Electronically signed by:  Benito Mccreedy D.[facility] 3:14 PM 11/02/22

## 2022-11-02 NOTE — Patient Instructions (Addendum)
Good to see you  Recommend using a lace up ankle brace on days when you are active or in pain  Can use Voltaren gel topically for pain relief  Ankle HEP  Pt referral  3-4 week follow up

## 2022-11-02 NOTE — Telephone Encounter (Signed)
Lmom for pt to call back. 

## 2022-11-05 NOTE — Telephone Encounter (Signed)
Pt has cpe sch for 11-23-2022

## 2022-11-06 NOTE — Telephone Encounter (Signed)
Refill request denied for vit b12.  She hasn't been given this sine 2021 or 22 per chart review.  Can address at upcoming appt.

## 2022-11-09 ENCOUNTER — Other Ambulatory Visit: Payer: Self-pay | Admitting: Family Medicine

## 2022-11-09 DIAGNOSIS — J302 Other seasonal allergic rhinitis: Secondary | ICD-10-CM

## 2022-11-10 ENCOUNTER — Ambulatory Visit: Payer: BC Managed Care – PPO | Attending: Physical Therapy | Admitting: Physical Therapy

## 2022-11-10 NOTE — Therapy (Incomplete)
OUTPATIENT PHYSICAL THERAPY LOWER EXTREMITY EVALUATION   Patient Name: Laura Howell MRN: [id number] DOB:08/17/1961, 61 y.o., female Today's Date: 11/10/2022    Past Medical History:  Diagnosis Date   B12 deficiency    Depression    Thyroid disease    Past Surgical History:  Procedure Laterality Date   LAPAROSCOPY  1997   endrometriosis   Patient Active Problem List   Diagnosis Date Noted   Elevated LFTs 04/15/2020   Alcohol use 04/15/2020   Acquired hypothyroidism 06/17/2007   Depression, recurrent (HCC) 06/17/2007    PCP: Deeann Saint, MD  REFERRING PROVIDER: Richardean Sale, DO  REFERRING DIAG: Chronic pain of right ankle  THERAPY DIAG:  No diagnosis found.  Rationale for Evaluation and Treatment: Rehabilitation  ONSET DATE: May 2023   SUBJECTIVE:  SUBJECTIVE STATEMENT: ***  PERTINENT HISTORY: ***  PAIN:  Are you having pain? Yes:  NPRS scale: ***/10 Pain location: Right ankle Pain description: *** Aggravating factors: *** Relieving factors: ***  PRECAUTIONS: {Therapy precautions:24002}  WEIGHT BEARING RESTRICTIONS: No  FALLS:  Has patient fallen in last 6 months? {fallsyesno:27318}  LIVING ENVIRONMENT: Lives with: {[facility] lives with:25569::"lives with their family"} Lives in: {Lives in:[address]} Stairs: {opstairs:27293} Has following equipment at home: {Assistive devices:23999}  OCCUPATION: ***  PLOF: Independent  PATIENT GOALS: ***   OBJECTIVE:  PATIENT SURVEYS:  FOTO ***  COGNITION: Overall cognitive status: Within functional limits for tasks assessed     SENSATION: WFL  EDEMA:  {edema:24020}  MUSCLE LENGTH: ***  PALPATION: ***  LOWER EXTREMITY ROM:  Active ROM Right eval Left eval  Ankle dorsiflexion    Ankle plantarflexion    Ankle inversion    Ankle eversion     LOWER EXTREMITY MMT:  MMT Right eval Left eval  Hip flexion    Hip extension    Hip abduction    Ankle dorsiflexion    Ankle  plantarflexion    Ankle inversion    Ankle eversion     LOWER EXTREMITY SPECIAL TESTS:  {LEspecialtests:26242}  FUNCTIONAL TESTS:  {Functional tests:24029}  GAIT: Distance walked: *** Assistive device utilized: {Assistive devices:23999} Level of assistance: {Levels of assistance:24026} Comments: ***   TODAY'S TREATMENT:     [facility] Adult PT Treatment:                                                DATE: 11/10/2022 Therapeutic Exercise: ***  PATIENT EDUCATION:  Education details: Exam findings, POC, HEP Person educated: Patient Education method: Explanation, Demonstration, Tactile cues, Verbal cues, and Handouts Education comprehension: verbalized understanding, returned demonstration, verbal cues required, tactile cues required, and needs further education  HOME EXERCISE PROGRAM: ***   ASSESSMENT: CLINICAL IMPRESSION: Patient is a 61 y.o. female who was seen today for physical therapy evaluation and treatment for ***.   OBJECTIVE IMPAIRMENTS: {opptimpairments:25111}.   ACTIVITY LIMITATIONS: {activitylimitations:27494}  PARTICIPATION LIMITATIONS: {participationrestrictions:25113}  PERSONAL FACTORS: {Personal factors:25162} are also affecting patient's functional outcome.   REHAB POTENTIAL: {rehabpotential:25112}  CLINICAL DECISION MAKING: {clinical decision making:25114}  EVALUATION COMPLEXITY: {Evaluation complexity:25115}   GOALS: Goals reviewed with patient? Yes  SHORT TERM GOALS: Target date: {follow up:25551}   Patient will be I with initial HEP in order to progress with therapy. Baseline: HEP provided at eval Goal status: INITIAL  2.  PT will review FOTO with patient by 3rd visit in order to understand  expected progress and outcome with therapy. Baseline: FOTO assessed at eval Goal status: INITIAL  3.  *** Baseline:  Goal status: INITIAL  LONG TERM GOALS: Target date: {follow up:25551}   Patient will be I with final HEP to maintain progress from  PT. Baseline: HEP provided at eval Goal status: INITIAL  2.  Patient will report >/= ***% status on FOTO to indicate improved functional ability. Baseline: % functional status Goal status: INITIAL  3.  *** Baseline:  Goal status: INITIAL  4.  *** Baseline:  Goal status: INITIAL   PLAN: PT FREQUENCY: {rehab frequency:25116}  PT DURATION: {rehab duration:25117}  PLANNED INTERVENTIONS: {rehab planned interventions:25118::"Therapeutic exercises","Therapeutic activity","Neuromuscular re-education","Balance training","Gait training","Patient/Family education","Self Care","Joint mobilization"}  PLAN FOR NEXT SESSION: Review HEP and progress PRN, ***   Rosana Hoes, PT, DPT, LAT, ATC 11/10/22  1:32 PM Phone: [phone] Fax: [phone]

## 2022-11-11 ENCOUNTER — Ambulatory Visit: Payer: BC Managed Care – PPO | Admitting: Family Medicine

## 2022-11-11 ENCOUNTER — Other Ambulatory Visit: Payer: Self-pay | Admitting: Family Medicine

## 2022-11-11 VITALS — BP 122/76 | HR 56 | Temp 98.5°F | Wt 123.8 lb

## 2022-11-11 DIAGNOSIS — Z789 Other specified health status: Secondary | ICD-10-CM

## 2022-11-11 DIAGNOSIS — G8929 Other chronic pain: Secondary | ICD-10-CM

## 2022-11-11 DIAGNOSIS — E538 Deficiency of other specified B group vitamins: Secondary | ICD-10-CM | POA: Diagnosis not present

## 2022-11-11 DIAGNOSIS — M25571 Pain in right ankle and joints of right foot: Secondary | ICD-10-CM

## 2022-11-11 DIAGNOSIS — Z1231 Encounter for screening mammogram for malignant neoplasm of breast: Secondary | ICD-10-CM

## 2022-11-11 DIAGNOSIS — I499 Cardiac arrhythmia, unspecified: Secondary | ICD-10-CM

## 2022-11-11 DIAGNOSIS — S93401D Sprain of unspecified ligament of right ankle, subsequent encounter: Secondary | ICD-10-CM

## 2022-11-11 DIAGNOSIS — R001 Bradycardia, unspecified: Secondary | ICD-10-CM

## 2022-11-11 MED ORDER — CYANOCOBALAMIN 1000 MCG/ML IJ SOLN
1000.0000 ug | Freq: Once | INTRAMUSCULAR | Status: AC
  Administered 2022-11-11: 1000 ug via INTRAMUSCULAR

## 2022-11-11 NOTE — Progress Notes (Signed)
Subjective:    Patient ID: Laura Howell, female    DOB: [birth year], 61 y.o.   MRN: [id number]  Chief Complaint  Patient presents with   Medication Refill    B12 refill Right ankle, twisted it back in may. Is seeing Dr Jean Rosenthal, has been in boot and it is just not healing.    HPI Patient was seen today for f/u.  Pt requesting b12 refill.  States still drinking.  Is on Naltrexone but only able to tolerate half tab as causing nightmares.  Pt seen at [facility].  Endorses 8 pound weight loss.  Also on Wellbutrin.  Notes h/o R ankle sprain after inverting foot in May.  Seen by Dr. Jean Rosenthal.  Wearing a boot.  In PT, but still having discomfort.  Past Medical History:  Diagnosis Date   B12 deficiency    Depression    Thyroid disease     Allergies  Allergen Reactions   Erythromycin Rash   Tetracyclines & Related Rash    ROS General: Denies fever, chills, night sweats, changes in appetite  + changes in weight HEENT: Denies headaches, ear pain, changes in vision, rhinorrhea, sore throat CV: Denies CP, palpitations, SOB, orthopnea Pulm: Denies SOB, cough, wheezing GI: Denies abdominal pain, nausea, vomiting, diarrhea, constipation GU: Denies dysuria, hematuria, frequency, vaginal discharge Msk: Denies muscle cramps, joint pains  + right ankle pain Neuro: Denies weakness, numbness, tingling Skin: Denies rashes, bruising Psych: Denies depression, anxiety, hallucinations     Objective:    Blood pressure 122/76, pulse (!) 56, temperature 98.5 F (36.9 C), temperature source Oral, weight 123 lb 12.8 oz (56.2 kg), SpO2 99 %.   Gen. Pleasant, well-nourished, in no distress, normal affect   HEENT: [place]/AT, face symmetric, conjunctiva clear, no scleral icterus, PERRLA, EOMI, nares patent without drainage Lungs: no accessory muscle use, CTAB, no wheezes or rales Cardiovascular: Bradycardia with skipped beat, no m/r/g, no peripheral edema Abdomen: BS present, soft, NT/ND, no  hepatosplenomegaly. Musculoskeletal: TTP right ankle.  No deformities, no cyanosis or clubbing, normal tone Neuro:  A&Ox3, CN II-XII intact, normal gait Skin:  Warm, no lesions/ rash   Wt Readings from Last 3 Encounters:  11/11/22 123 lb 12.8 oz (56.2 kg)  11/02/22 120 lb (54.4 kg)  05/22/22 131 lb (59.4 kg)    Lab Results  Component Value Date   WBC 5.3 06/27/2021   HGB 14.4 06/27/2021   HCT 42.6 06/27/2021   PLT 270.0 06/27/2021   GLUCOSE 85 06/27/2021   CHOL 195 06/27/2021   TRIG 76.0 06/27/2021   HDL 73.10 06/27/2021   LDLCALC 107 (H) 06/27/2021   ALT 28 06/27/2021   AST 39 (H) 06/27/2021   NA 139 06/27/2021   K 4.5 06/27/2021   CL 102 06/27/2021   CREATININE 0.89 06/27/2021   BUN 10 06/27/2021   CO2 29 06/27/2021   TSH 6.39 (H) 06/27/2021   HGBA1C 5.1 06/27/2021    Assessment/Plan:  Irregular heart beat -Patient asymptomatic -Bradycardia with skipped beat noted on exam -EKG done this visit with sinus bradycardia, VR 48.  Previous EKG from 10/23/2020 similar -Continue to monitor - Plan: CBC with Differential/Platelet, TSH, T4, Free, CMP, Magnesium, EKG 12-Lead  Chronic pain of right ankle -Status post right ankle inversion injury in May 2023 with continued symptoms -Continue supportive care, ankle exercises, wearing cam boot -Continue PT and follow-up with podiatry -Consider MRI given continued symptoms.  Vitamin B12 deficiency  -Likely 2/2 EtOH use - Plan: Vitamin B12, CBC with Differential/Platelet,  cyanocobalamin injection  Alcohol use -Cessation strongly encouraged -Continue follow-up with [facility] -Unable to take full dose of naltrexone 2/2 causing nightmares.  Continue half dose and Wellbutrin -Given strict precautions - Plan: Iron, TIBC and Ferritin Panel, CBC with Differential/Platelet, EKG 12-Lead  Sprain of right ankle, unspecified ligament, subsequent encounter  F/u in the next few months, sooner if needed  Abbe Amsterdam, MD

## 2022-11-12 LAB — CBC WITH DIFFERENTIAL/PLATELET
Basophils Absolute: 0.1 10*3/uL (ref 0.0–0.1)
Basophils Relative: 0.9 % (ref 0.0–3.0)
Eosinophils Absolute: 0.1 10*3/uL (ref 0.0–0.7)
Eosinophils Relative: 1.8 % (ref 0.0–5.0)
HCT: 43.6 % (ref 36.0–46.0)
Hemoglobin: 14.7 g/dL (ref 12.0–15.0)
Lymphocytes Relative: 26.4 % (ref 12.0–46.0)
Lymphs Abs: 1.6 10*3/uL (ref 0.7–4.0)
MCHC: 33.7 g/dL (ref 30.0–36.0)
MCV: 104.2 fl — ABNORMAL HIGH (ref 78.0–100.0)
Monocytes Absolute: 0.9 10*3/uL (ref 0.1–1.0)
Monocytes Relative: 14 % — ABNORMAL HIGH (ref 3.0–12.0)
Neutro Abs: 3.5 10*3/uL (ref 1.4–7.7)
Neutrophils Relative %: 56.9 % (ref 43.0–77.0)
Platelets: 340 10*3/uL (ref 150.0–400.0)
RBC: 4.18 Mil/uL (ref 3.87–5.11)
RDW: 12.3 % (ref 11.5–15.5)
WBC: 6.1 10*3/uL (ref 4.0–10.5)

## 2022-11-12 LAB — COMPREHENSIVE METABOLIC PANEL
ALT: 27 U/L (ref 0–35)
AST: 43 U/L — ABNORMAL HIGH (ref 0–37)
Albumin: 4.3 g/dL (ref 3.5–5.2)
Alkaline Phosphatase: 61 U/L (ref 39–117)
BUN: 7 mg/dL (ref 6–23)
CO2: 31 mEq/L (ref 19–32)
Calcium: 10 mg/dL (ref 8.4–10.5)
Chloride: 98 mEq/L (ref 96–112)
Creatinine, Ser: 0.85 mg/dL (ref 0.40–1.20)
GFR: 74.66 mL/min (ref >60.00)
Glucose, Bld: 79 mg/dL (ref 70–99)
Potassium: 4.8 mEq/L (ref 3.5–5.1)
Sodium: 134 mEq/L — ABNORMAL LOW (ref 135–145)
Total Bilirubin: 0.7 mg/dL (ref 0.2–1.2)
Total Protein: 7.4 g/dL (ref 6.0–8.3)

## 2022-11-12 LAB — IRON,TIBC AND FERRITIN PANEL
%SAT: 34 % (calc) (ref 16–45)
Ferritin: 45 ng/mL (ref 16–232)
Iron: 119 ug/dL (ref 45–160)
TIBC: 347 mcg/dL (calc) (ref 250–450)

## 2022-11-12 LAB — VITAMIN D 25 HYDROXY (VIT D DEFICIENCY, FRACTURES): VITD: 43.97 ng/mL (ref 30.00–100.00)

## 2022-11-12 LAB — MAGNESIUM: Magnesium: 2 mg/dL (ref 1.5–2.5)

## 2022-11-12 LAB — VITAMIN B12: Vitamin B-12: 289 pg/mL (ref 211–911)

## 2022-11-12 LAB — T4, FREE: Free T4: 0.94 ng/dL (ref 0.60–1.60)

## 2022-11-12 LAB — TSH: TSH: 1.38 u[IU]/mL (ref 0.35–5.50)

## 2022-11-23 ENCOUNTER — Encounter: Payer: BC Managed Care – PPO | Admitting: Family Medicine

## 2022-11-27 ENCOUNTER — Other Ambulatory Visit: Payer: Self-pay | Admitting: Family Medicine

## 2022-11-27 DIAGNOSIS — J302 Other seasonal allergic rhinitis: Secondary | ICD-10-CM

## 2022-11-29 ENCOUNTER — Encounter: Payer: Self-pay | Admitting: Family Medicine

## 2022-11-30 NOTE — Progress Notes (Deleted)
    Laura [facility]ell D.[name] [address] [place] [address] Phone: [phone]   Assessment and Plan:     There are no diagnoses linked to this encounter.  ***   Pertinent previous records reviewed include ***   Follow Up: ***     Subjective:   I, Laura Howell, am serving as a Neurosurgeon for Doctor Richardean Sale  Chief Complaint: right ankle pain    HPI:    05/22/2022 Patient is a 61 year old female complaining of ankle pain. Patient states twisted her right ankle 2 weeks , no numbness or tingling lateral ankle pain , pain stay in the ankle and on top of the foot, has been taking ib not on a regular basis , has been icing, no locking clicking or popping    06/05/2022 Patient states that it is feeling okay not healing as fast as she would like it to. Does state it is better than last visit.    11/02/2022 Patient states that her ankle is worst , she states she re-injured she was jumping up and down and then last week , she twisted the wrong way    12/01/2022 Patient states   Relevant Historical Information: Acquired hypothyroidism  Additional pertinent review of systems negative.   Current Outpatient Medications:    buPROPion (WELLBUTRIN XL) 150 MG 24 hr tablet, Take 150 mg by mouth every morning., Disp: , Rfl:    cyanocobalamin (,VITAMIN B-12,) 1000 MCG/ML injection, INJECT 1 ML INTO THE MUSCLE ONCE FOR 1 DOSE, Disp: 3 mL, Rfl: 3   fluticasone (FLONASE) 50 MCG/ACT nasal spray, SPRAY 1 SPRAY INTO BOTH NOSTRILS DAILY., Disp: 16 mL, Rfl: 0   levothyroxine (SYNTHROID) 100 MCG tablet, TAKE ONE TABLET BY MOUTH DAILY, Disp: 90 tablet, Rfl: 0   naltrexone (DEPADE) 50 MG tablet, Take 50 mg by mouth daily., Disp: , Rfl:    nystatin (MYCOSTATIN/NYSTOP) powder, Apply 1 application topically 3 (three) times daily., Disp: 15 g, Rfl: 1   traZODone (DESYREL) 100 MG tablet, Take 50 mg by mouth at bedtime., Disp: , Rfl: 2   TRINTELLIX 20 MG TABS  tablet, Take 20 mg by mouth daily., Disp: , Rfl: 2   zolpidem (AMBIEN) 5 MG tablet, Take 1 tablet (5 mg total) by mouth at bedtime as needed for sleep., Disp: 30 tablet, Rfl: 1   Objective:     There were no vitals filed for this visit.    There is no height or weight on file to calculate BMI.    Physical Exam:    ***   Electronically signed by:  Laura Howell [name] 12:20 PM 11/30/22

## 2022-12-01 ENCOUNTER — Ambulatory Visit: Payer: BC Managed Care – PPO | Admitting: Sports Medicine

## 2022-12-09 DIAGNOSIS — F332 Major depressive disorder, recurrent severe without psychotic features: Secondary | ICD-10-CM | POA: Diagnosis not present

## 2022-12-10 DIAGNOSIS — F332 Major depressive disorder, recurrent severe without psychotic features: Secondary | ICD-10-CM | POA: Diagnosis not present

## 2022-12-11 DIAGNOSIS — F332 Major depressive disorder, recurrent severe without psychotic features: Secondary | ICD-10-CM | POA: Diagnosis not present

## 2022-12-15 DIAGNOSIS — F332 Major depressive disorder, recurrent severe without psychotic features: Secondary | ICD-10-CM | POA: Diagnosis not present

## 2022-12-15 NOTE — Therapy (Incomplete)
OUTPATIENT PHYSICAL THERAPY LOWER EXTREMITY EVALUATION   Patient Name: Laura Howell MRN: [id number] DOB:January 23, 1961, 61 y.o., female Today's Date: 12/15/2022  END OF SESSION:   Past Medical History:  Diagnosis Date   B12 deficiency    Depression    Thyroid disease    Past Surgical History:  Procedure Laterality Date   LAPAROSCOPY  1997   endrometriosis   Patient Active Problem List   Diagnosis Date Noted   Elevated LFTs 04/15/2020   Alcohol use 04/15/2020   Acquired hypothyroidism 06/17/2007   Depression, recurrent (HCC) 06/17/2007    PCP: Deeann Saint, MD   REFERRING PROVIDER: Richardean Sale, DO  REFERRING DIAG: [phone] (ICD-10-CM) - Chronic pain of right ankle; S93.401A (ICD-10-CM) - Inversion sprain of right ankle, initial encounter.   THERAPY DIAG:  No diagnosis found.  Rationale for Evaluation and Treatment: Rehabilitation  ONSET DATE: ***  SUBJECTIVE:   SUBJECTIVE STATEMENT: ***  PERTINENT HISTORY: *** PAIN:  Are you having pain? {OPRCPAIN:27236}  PRECAUTIONS: {Therapy precautions:24002}  WEIGHT BEARING RESTRICTIONS: {Yes ***/No:24003}  FALLS:  Has patient fallen in last 6 months? {fallsyesno:27318}  LIVING ENVIRONMENT: Lives with: {[facility] lives with:25569::"lives with their family"} Lives in: {Lives in:[address]} Stairs: {opstairs:27293} Has following equipment at home: {Assistive devices:23999}  OCCUPATION: ***  PLOF: {PLOF:24004}  PATIENT GOALS: ***  NEXT MD VISIT:   OBJECTIVE:   DIAGNOSTIC FINDINGS: ***  PATIENT SURVEYS:  {rehab surveys:24030}  COGNITION: Overall cognitive status: {cognition:24006}     SENSATION: {sensation:27233}  EDEMA:  {edema:24020}  MUSCLE LENGTH: Hamstrings: Right *** deg; Left *** deg Thomas test: Right *** deg; Left *** deg  POSTURE: {posture:25561}  PALPATION: ***  LOWER EXTREMITY ROM:  {AROM/PROM:27142} ROM Right eval Left eval  Hip flexion    Hip extension    Hip  abduction    Hip adduction    Hip internal rotation    Hip external rotation    Knee flexion    Knee extension    Ankle dorsiflexion    Ankle plantarflexion    Ankle inversion    Ankle eversion     (Blank rows = not tested)  LOWER EXTREMITY MMT:  MMT Right eval Left eval  Hip flexion    Hip extension    Hip abduction    Hip adduction    Hip internal rotation    Hip external rotation    Knee flexion    Knee extension    Ankle dorsiflexion    Ankle plantarflexion    Ankle inversion    Ankle eversion     (Blank rows = not tested)  LOWER EXTREMITY SPECIAL TESTS:  {LEspecialtests:26242}  FUNCTIONAL TESTS:  {Functional tests:24029}  GAIT: Distance walked: *** Assistive device utilized: {Assistive devices:23999} Level of assistance: {Levels of assistance:24026} Comments: ***   TODAY'S TREATMENT:  DATE: ***    PATIENT EDUCATION:  Education details: *** Person educated: {Person educated:25204} Education method: {Education Method:25205} Education comprehension: {Education Comprehension:25206}  HOME EXERCISE PROGRAM: ***  ASSESSMENT:  CLINICAL IMPRESSION: Patient is a 61 y.o. female who was seen today for physical therapy evaluation and treatment for M25.571,G89.29 (ICD-10-CM) - Chronic pain of right ankle; S93.401A (ICD-10-CM) - Inversion sprain of right ankle, initial encounter.   OBJECTIVE IMPAIRMENTS: {opptimpairments:25111}.   ACTIVITY LIMITATIONS: {activitylimitations:27494}  PARTICIPATION LIMITATIONS: {participationrestrictions:25113}  PERSONAL FACTORS: {Personal factors:25162} are also affecting patient's functional outcome.   REHAB POTENTIAL: {rehabpotential:25112}  CLINICAL DECISION MAKING: {clinical decision making:25114}  EVALUATION COMPLEXITY: {Evaluation complexity:25115}   GOALS: Goals reviewed with patient?  {yes/no:20286}  SHORT TERM GOALS: Target date: *** *** Baseline: Goal status: {GOALSTATUS:25110}  2.  *** Baseline:  Goal status: {GOALSTATUS:25110}  3.  *** Baseline:  Goal status: {GOALSTATUS:25110}  4.  *** Baseline:  Goal status: {GOALSTATUS:25110}  5.  *** Baseline:  Goal status: {GOALSTATUS:25110}  6.  *** Baseline:  Goal status: {GOALSTATUS:25110}  LONG TERM GOALS: Target date: ***  *** Baseline:  Goal status: {GOALSTATUS:25110}  2.  *** Baseline:  Goal status: {GOALSTATUS:25110}  3.  *** Baseline:  Goal status: {GOALSTATUS:25110}  4.  *** Baseline:  Goal status: {GOALSTATUS:25110}  5.  *** Baseline:  Goal status: {GOALSTATUS:25110}  6.  *** Baseline:  Goal status: {GOALSTATUS:25110}   PLAN:  PT FREQUENCY: {rehab frequency:25116}  PT DURATION: {rehab duration:25117}  PLANNED INTERVENTIONS: {rehab planned interventions:25118::"Therapeutic exercises","Therapeutic activity","Neuromuscular re-education","Balance training","Gait training","Patient/Family education","Self Care","Joint mobilization"}  PLAN FOR NEXT SESSION: ***   Joellyn Rued, PT 12/15/2022, 9:54 PM

## 2022-12-16 ENCOUNTER — Ambulatory Visit: Payer: BC Managed Care – PPO

## 2022-12-16 DIAGNOSIS — F332 Major depressive disorder, recurrent severe without psychotic features: Secondary | ICD-10-CM | POA: Diagnosis not present

## 2022-12-17 DIAGNOSIS — F332 Major depressive disorder, recurrent severe without psychotic features: Secondary | ICD-10-CM | POA: Diagnosis not present

## 2022-12-22 DIAGNOSIS — F332 Major depressive disorder, recurrent severe without psychotic features: Secondary | ICD-10-CM | POA: Diagnosis not present

## 2022-12-23 ENCOUNTER — Ambulatory Visit (INDEPENDENT_AMBULATORY_CARE_PROVIDER_SITE_OTHER): Payer: BC Managed Care – PPO | Admitting: Family Medicine

## 2022-12-23 ENCOUNTER — Encounter: Payer: Self-pay | Admitting: Family Medicine

## 2022-12-23 ENCOUNTER — Ambulatory Visit: Payer: BC Managed Care – PPO | Admitting: Family Medicine

## 2022-12-23 VITALS — BP 100/60 | HR 75 | Temp 98.0°F | Ht 62.0 in | Wt 120.2 lb

## 2022-12-23 DIAGNOSIS — J019 Acute sinusitis, unspecified: Secondary | ICD-10-CM | POA: Diagnosis not present

## 2022-12-23 DIAGNOSIS — F332 Major depressive disorder, recurrent severe without psychotic features: Secondary | ICD-10-CM | POA: Diagnosis not present

## 2022-12-23 DIAGNOSIS — E538 Deficiency of other specified B group vitamins: Secondary | ICD-10-CM

## 2022-12-23 MED ORDER — CYANOCOBALAMIN 1000 MCG/ML IJ SOLN: INTRAMUSCULAR | 3 refills | Status: DC

## 2022-12-23 MED ORDER — AMOXICILLIN-POT CLAVULANATE 875-125 MG PO TABS: 1.0000 | ORAL_TABLET | Freq: Two times a day (BID) | ORAL | 0 refills | Status: DC

## 2022-12-23 MED ORDER — CYANOCOBALAMIN 1000 MCG/ML IJ SOLN
1000.0000 ug | Freq: Once | INTRAMUSCULAR | Status: AC
  Administered 2022-12-23: 1000 ug via INTRAMUSCULAR

## 2022-12-23 NOTE — Progress Notes (Signed)
Established Patient Office Visit  Subjective   Patient ID: Laura Howell, female    DOB: May 01, 1961  Age: 61 y.o. MRN: [id number]  Chief Complaint  Patient presents with   Cough    Patient complains of cough, x1 week, Non productive, Tried Dayquil and Nyquil with little relief    Sinusitis    Patient complains of sinusitis, x1 week     HPI   [name] is seen with persistent sinusitis symptoms now for about 12 days.  Not seeming to improve any.  Her husband has had similar symptoms.  She has no cough for over a week has had some facial pain and headaches and sinus congestion now for almost 2 weeks.  Does not seem to be improving any.  She has tried Warden/ranger with very little relief.  No history of frequent sinusitis.  She has B12 deficiency.  Has recently been taking oral but would like to go back on B12 injections monthly.  She is requesting refill.  She is also requesting repeat B12 IM today.  Her last injection was back in November  Past Medical History:  Diagnosis Date   B12 deficiency    Depression    Thyroid disease    Past Surgical History:  Procedure Laterality Date   LAPAROSCOPY  1997   endrometriosis    reports that she has never smoked. She has never used smokeless tobacco. She reports current alcohol use. She reports that she does not use drugs. family history includes Cancer in her father; Heart disease in her father, maternal grandfather, and paternal grandfather. Allergies  Allergen Reactions   Erythromycin Rash   Tetracyclines & Related Rash    Review of Systems  Constitutional:  Positive for malaise/fatigue. Negative for chills and fever.  HENT:  Positive for congestion and sinus pain.   Respiratory:  Positive for cough.   Neurological:  Positive for headaches.      Objective:     BP 100/60 (BP Location: Left Arm, Patient Position: Sitting, Cuff Size: Normal)   Pulse 75   Temp 98 F (36.7 C) (Oral)   Ht 5\' 2"  (1.575 m)   Wt 120 lb  3.2 oz (54.5 kg)   SpO2 98%   BMI 21.98 kg/m  BP Readings from Last 3 Encounters:  12/23/22 100/60  11/11/22 122/76  11/02/22 110/80   Wt Readings from Last 3 Encounters:  12/23/22 120 lb 3.2 oz (54.5 kg)  11/11/22 123 lb 12.8 oz (56.2 kg)  11/02/22 120 lb (54.4 kg)      Physical Exam Vitals reviewed.  Constitutional:      General: She is not in acute distress.    Appearance: She is not ill-appearing.  HENT:     Right Ear: Tympanic membrane normal.     Left Ear: Tympanic membrane normal.     Mouth/Throat:     Mouth: Mucous membranes are moist.     Pharynx: No oropharyngeal exudate or posterior oropharyngeal erythema.  Cardiovascular:     Rate and Rhythm: Normal rate and regular rhythm.  Pulmonary:     Effort: Pulmonary effort is normal.     Breath sounds: Normal breath sounds. No wheezing or rales.  Musculoskeletal:     Cervical back: Neck supple.  Neurological:     Mental Status: She is alert.      No results found for any visits on 12/23/22.    The 10-year ASCVD risk score (Arnett DK, et al., 2019) is: 1.7%  Assessment & Plan:   Patient presents with almost 2-week history of persistent sinusitis symptoms.  This sounded more viral to begin with but she has had if anything progression of symptoms in terms of headaches and facial pain.  We decided to go ahead and cover with Augmentin 875 mg twice daily for 10 days.  B12 injection given per her request.  She takes this monthly.  Refill given for B12  -Follow-up with primary for any persistent or worsening symptoms.  Evelena Peat, MD

## 2022-12-24 DIAGNOSIS — F332 Major depressive disorder, recurrent severe without psychotic features: Secondary | ICD-10-CM | POA: Diagnosis not present

## 2022-12-25 ENCOUNTER — Other Ambulatory Visit: Payer: Self-pay | Admitting: Family Medicine

## 2022-12-25 DIAGNOSIS — F332 Major depressive disorder, recurrent severe without psychotic features: Secondary | ICD-10-CM | POA: Diagnosis not present

## 2022-12-25 DIAGNOSIS — J302 Other seasonal allergic rhinitis: Secondary | ICD-10-CM

## 2022-12-29 DIAGNOSIS — F332 Major depressive disorder, recurrent severe without psychotic features: Secondary | ICD-10-CM | POA: Diagnosis not present

## 2022-12-30 ENCOUNTER — Ambulatory Visit: Payer: BC Managed Care – PPO | Attending: Physical Therapy

## 2022-12-30 ENCOUNTER — Other Ambulatory Visit: Payer: Self-pay

## 2022-12-30 DIAGNOSIS — M6281 Muscle weakness (generalized): Secondary | ICD-10-CM | POA: Insufficient documentation

## 2022-12-30 DIAGNOSIS — R262 Difficulty in walking, not elsewhere classified: Secondary | ICD-10-CM

## 2022-12-30 DIAGNOSIS — M25571 Pain in right ankle and joints of right foot: Secondary | ICD-10-CM | POA: Insufficient documentation

## 2022-12-30 DIAGNOSIS — G8929 Other chronic pain: Secondary | ICD-10-CM | POA: Diagnosis not present

## 2022-12-30 DIAGNOSIS — F332 Major depressive disorder, recurrent severe without psychotic features: Secondary | ICD-10-CM | POA: Diagnosis not present

## 2022-12-30 NOTE — Therapy (Addendum)
OUTPATIENT PHYSICAL THERAPY LOWER EXTREMITY EVALUATION/Discharge   Patient Name: Laura Howell MRN: [id number] DOB:07/28/1961, 62 y.o., female Today's Date: 12/30/2022  END OF SESSION:  PT End of Session - 12/30/22 1715     Visit Number 1    Number of Visits 9    Date for PT Re-Evaluation 03/05/23    Authorization Type BCBS COMM PPO    Progress Note Due on Visit 10    PT Start Time 1555    PT Stop Time 1640    PT Time Calculation (min) 45 min    Activity Tolerance Patient tolerated treatment well    Behavior During Therapy [facility] for tasks assessed/performed             Past Medical History:  Diagnosis Date   B12 deficiency    Depression    Thyroid disease    Past Surgical History:  Procedure Laterality Date   LAPAROSCOPY  1997   endrometriosis   Patient Active Problem List   Diagnosis Date Noted   Elevated LFTs 04/15/2020   Alcohol use 04/15/2020   Acquired hypothyroidism 06/17/2007   Depression, recurrent (HCC) 06/17/2007    PCP: Deeann Saint, MD  REFERRING PROVIDER: Deeann Saint, MD   REFERRING DIAG: [phone] (ICD-10-CM) - Chronic pain of right ankle, S93.401A (ICD-10-CM) - Inversion sprain of right ankle, initial encounter.  THERAPY DIAG:  Chronic pain of right ankle  Muscle weakness (generalized)  Difficulty in walking, not elsewhere classified  Rationale for Evaluation and Treatment: Rehabilitation  ONSET DATE: 1st of May 2023  SUBJECTIVE:   SUBJECTIVE STATEMENT: MOI: During a hypotension event, the pt fell injuring her R ankle. Pt reports she has re-aggravated it on a few other occasions. 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 Pt endorses improvement since the initial injury, but it has plateaued .  PERTINENT HISTORY: depression  PAIN:  Are you having pain? Yes: NPRS scale: 3/10 Pain location: R lateral ankle Pain description: ache, intermittent Aggravating factors: prolonged time on  feet Relieving factors: Rest, elevation 0-6/10  PRECAUTIONS: None  WEIGHT BEARING RESTRICTIONS: No  FALLS:  Has patient fallen in last 6 months? 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  PATIENT SURVEYS:  FOTO: Perceived function   50%, predicted   68%   COGNITION: Overall cognitive status: Within functional limits for tasks assessed     SENSATION: WFL  EDEMA:  NT  MUSCLE LENGTH: Hamstrings: Right NT deg; Left NT deg Maisie Fus test: Right NT deg; Left NT deg  POSTURE: No Significant postural limitations  PALPATION: TTP over the ATF lig  LOWER EXTREMITY ROM: AROM were WNLs. Pain at the end range of R ankle inversion and DF motions Active ROM Right eval Left eval  Hip flexion    Hip extension    Hip abduction    Hip adduction    Hip internal rotation    Hip external rotation    Knee flexion    Knee extension    Ankle dorsiflexion 15 15  Ankle plantarflexion    Ankle inversion    Ankle eversion     (Blank rows =  not tested)  LOWER EXTREMITY MMT:  MMT Right eval Left eval  Hip flexion    Hip extension    Hip abduction    Hip adduction    Hip internal rotation    Hip external rotation    Knee flexion    Knee extension    Ankle dorsiflexion 4+ 5  Ankle plantarflexion 5 5  Ankle inversion 5 5  Ankle eversion 4+ 5   (Blank rows = not tested)  LOWER EXTREMITY SPECIAL TESTS:  Ankle special tests: Anterior drawer test: positive  Minimal decrease stability Single leg stand= R=15 sec-increased sway/compensation L=22 sec  FUNCTIONAL TESTS:  2 minute walk test: TBA  GAIT: Distance walked:  229ft Assistive device utilized: None Level of assistance: Complete Independence Comments: Min antalgic gait over the R ankle/foot   TODAY'S TREATMENT:                                                                                                                               [facility] Adult PT Treatment:                                                DATE: 12/30/22 Therapeutic Exercise: Developed, instructed in, and pt completed therex as noted in HEP  Self Care: Cross friction massage, warm up/heat prior to activities, cold pack and elevation  PATIENT EDUCATION:  Education details: Eval findings, POC, HEP, self care  Person educated: Patient Education method: Explanation, Demonstration, Tactile cues, Verbal cues, and Handouts Education comprehension: verbalized understanding, returned demonstration, verbal cues required, and tactile cues required  HOME EXERCISE PROGRAM: Access Code: [email or web address] URL: [email or web address] Date: 12/30/2022 Prepared by: Gar Ponto  Exercises - Single Leg Stance with Support  - 1 x daily - 7 x weekly - 1 sets - 5 reps - 30 hold - Heel Toe Raises with Counter Support  - 1 x daily - 7 x weekly - 2-3 sets - 10 reps - 3 hold - Seated Ankle Dorsiflexion with Resistance  - 1 x daily - 7 x weekly - 2-3 sets - 10 reps - 3 hold - Seated Ankle Eversion with Resistance  - 1 x daily - 7 x weekly - 2-3 sets - 10 reps - 3 hold - Seated Ankle Inversion with Anchored Resistance (Mirrored)  - 1 x daily - 7 x weekly - 2-3 sets - 10 reps - 3 hold - Ankle Plantar Flexion with Resistance  - 1 x daily - 7 x weekly - 2-3 sets - 10 reps - 3 hold  ASSESSMENT:  CLINICAL IMPRESSION: Patient is a 62 y.o. female who was seen today for physical therapy evaluation and treatment for M25.571,G89.29 (ICD-10-CM) - Chronic pain of right ankle, S93.401A (ICD-10-CM) - Inversion sprain of right ankle, initial encounter. Pt presents with min decreased strength, min decreased  P/A  stability, a min antalgic gait and increased pain with prolonged walking. Pt will benefit from skilled PT to address impairments for improved function of the R ankle/LE with less pain.   OBJECTIVE IMPAIRMENTS: Abnormal gait, decreased activity tolerance, decreased balance, difficulty walking, decreased strength, and pain.   ACTIVITY LIMITATIONS: carrying, lifting, bending, squatting, stairs, and locomotion level  PARTICIPATION LIMITATIONS: cleaning, laundry, and occupation  PERSONAL FACTORS: Past/current experiences, Profession, and Time since onset of injury/illness/exacerbation are also affecting patient's functional outcome.   REHAB POTENTIAL: Good  CLINICAL DECISION MAKING: Stable/uncomplicated  EVALUATION COMPLEXITY: Low   GOALS:  SHORT TERM GOALS: Target date: 01/15/23 Pt will be Ind in an initial HEP  Baseline: initiated Goal status: INITIAL  2.  Pt will voice understanding of measures to assist in pain reduction  Baseline: initiated Goal status: INITIAL  LONG TERM GOALS: Target date: 03/05/23  Pt will be Ind in a final HEP to maintain achieved LOF  Baseline:  Goal status: INITIAL  2.  Increase R ankle strength to 5/5 for improved functional mobility with less pain Baseline: See flow sheets Goal status: INITIAL  3.  Increase R ankle/foot stance time to 22 sec with decreased sway and compensation  Baseline: 15 sec c sway and compensation Goal status: INITIAL  4.  Pt will report a decrease in R ankle pain to 2/10 or less with daily activities and 3/10 with work related activities Baseline: 0-6/10 Goal status: INITIAL  5.  Improve 2MWT by MCID of 3550ft as indication of improved functional mobility  Baseline: TBA Goal status: INITIAL  6.  Pt's FOTO score will improved to the predicted value of 68% as indication of improved function  Baseline: 50%  Goal status: INITIAL   PLAN:  PT FREQUENCY: 1x/week  PT DURATION: 8 weeks  PLANNED INTERVENTIONS: Therapeutic  exercises, Therapeutic activity, Neuromuscular re-education, Balance training, Gait training, Patient/Family education, Self Care, Joint mobilization, Dry Needling, Electrical stimulation, Cryotherapy, Moist heat, Taping, Ionotophoresis 4mg /ml Dexamethasone, and Manual therapy  PLAN FOR NEXT SESSION: Review FOTO; assess response to HEP; progress therex as indicated; use of modalities, manual therapy; and TPDN as indicated.   Joellyn RuedAllen  Urie Loughner, PT 12/30/2022, 5:17 PM   PHYSICAL THERAPY DISCHARGE SUMMARY  Visits from Start of Care: 1  Current functional level related to goals / functional outcomes: Pt did return for care   Remaining deficits: Pt did not return for care   Education / Equipment: HEP   Patient agrees to discharge. Patient goals were not met. Patient is being discharged due to not returning since the last visit.  Stuti Sandin MS, PT 04/06/23 8:49 AM

## 2022-12-31 ENCOUNTER — Ambulatory Visit
Admission: RE | Admit: 2022-12-31 | Discharge: 2022-12-31 | Disposition: A | Discharge status: Home / Self Care | Payer: BC Managed Care – PPO | Source: Ambulatory Visit | Attending: Family Medicine | Admitting: Family Medicine

## 2022-12-31 DIAGNOSIS — Z1231 Encounter for screening mammogram for malignant neoplasm of breast: Secondary | ICD-10-CM

## 2022-12-31 DIAGNOSIS — F332 Major depressive disorder, recurrent severe without psychotic features: Secondary | ICD-10-CM | POA: Diagnosis not present

## 2023-01-01 DIAGNOSIS — F332 Major depressive disorder, recurrent severe without psychotic features: Secondary | ICD-10-CM | POA: Diagnosis not present

## 2023-01-04 DIAGNOSIS — F332 Major depressive disorder, recurrent severe without psychotic features: Secondary | ICD-10-CM | POA: Diagnosis not present

## 2023-01-05 DIAGNOSIS — F332 Major depressive disorder, recurrent severe without psychotic features: Secondary | ICD-10-CM | POA: Diagnosis not present

## 2023-01-06 DIAGNOSIS — F332 Major depressive disorder, recurrent severe without psychotic features: Secondary | ICD-10-CM | POA: Diagnosis not present

## 2023-01-07 DIAGNOSIS — F332 Major depressive disorder, recurrent severe without psychotic features: Secondary | ICD-10-CM | POA: Diagnosis not present

## 2023-01-08 DIAGNOSIS — F332 Major depressive disorder, recurrent severe without psychotic features: Secondary | ICD-10-CM | POA: Diagnosis not present

## 2023-01-12 DIAGNOSIS — F332 Major depressive disorder, recurrent severe without psychotic features: Secondary | ICD-10-CM | POA: Diagnosis not present

## 2023-01-13 DIAGNOSIS — F332 Major depressive disorder, recurrent severe without psychotic features: Secondary | ICD-10-CM | POA: Diagnosis not present

## 2023-01-14 DIAGNOSIS — F332 Major depressive disorder, recurrent severe without psychotic features: Secondary | ICD-10-CM | POA: Diagnosis not present

## 2023-01-15 DIAGNOSIS — F332 Major depressive disorder, recurrent severe without psychotic features: Secondary | ICD-10-CM | POA: Diagnosis not present

## 2023-01-16 ENCOUNTER — Other Ambulatory Visit: Payer: Self-pay | Admitting: Family Medicine

## 2023-01-16 DIAGNOSIS — E039 Hypothyroidism, unspecified: Secondary | ICD-10-CM

## 2023-01-18 DIAGNOSIS — F332 Major depressive disorder, recurrent severe without psychotic features: Secondary | ICD-10-CM | POA: Diagnosis not present

## 2023-01-19 DIAGNOSIS — F332 Major depressive disorder, recurrent severe without psychotic features: Secondary | ICD-10-CM | POA: Diagnosis not present

## 2023-01-20 DIAGNOSIS — F332 Major depressive disorder, recurrent severe without psychotic features: Secondary | ICD-10-CM | POA: Diagnosis not present

## 2023-01-21 DIAGNOSIS — F332 Major depressive disorder, recurrent severe without psychotic features: Secondary | ICD-10-CM | POA: Diagnosis not present

## 2023-01-22 DIAGNOSIS — F332 Major depressive disorder, recurrent severe without psychotic features: Secondary | ICD-10-CM | POA: Diagnosis not present

## 2023-01-25 DIAGNOSIS — F332 Major depressive disorder, recurrent severe without psychotic features: Secondary | ICD-10-CM | POA: Diagnosis not present

## 2023-01-26 DIAGNOSIS — F332 Major depressive disorder, recurrent severe without psychotic features: Secondary | ICD-10-CM | POA: Diagnosis not present

## 2023-01-27 DIAGNOSIS — F332 Major depressive disorder, recurrent severe without psychotic features: Secondary | ICD-10-CM | POA: Diagnosis not present

## 2023-01-28 DIAGNOSIS — F332 Major depressive disorder, recurrent severe without psychotic features: Secondary | ICD-10-CM | POA: Diagnosis not present

## 2023-01-29 DIAGNOSIS — F332 Major depressive disorder, recurrent severe without psychotic features: Secondary | ICD-10-CM | POA: Diagnosis not present

## 2023-01-31 DIAGNOSIS — D518 Other vitamin B12 deficiency anemias: Secondary | ICD-10-CM | POA: Diagnosis not present

## 2023-02-01 DIAGNOSIS — M9906 Segmental and somatic dysfunction of lower extremity: Secondary | ICD-10-CM | POA: Diagnosis not present

## 2023-02-01 DIAGNOSIS — M7918 Myalgia, other site: Secondary | ICD-10-CM | POA: Diagnosis not present

## 2023-02-01 DIAGNOSIS — M25571 Pain in right ankle and joints of right foot: Secondary | ICD-10-CM | POA: Diagnosis not present

## 2023-02-01 DIAGNOSIS — F332 Major depressive disorder, recurrent severe without psychotic features: Secondary | ICD-10-CM | POA: Diagnosis not present

## 2023-02-02 DIAGNOSIS — F332 Major depressive disorder, recurrent severe without psychotic features: Secondary | ICD-10-CM | POA: Diagnosis not present

## 2023-02-03 DIAGNOSIS — F332 Major depressive disorder, recurrent severe without psychotic features: Secondary | ICD-10-CM | POA: Diagnosis not present

## 2023-02-09 DIAGNOSIS — M7918 Myalgia, other site: Secondary | ICD-10-CM | POA: Diagnosis not present

## 2023-02-09 DIAGNOSIS — M25571 Pain in right ankle and joints of right foot: Secondary | ICD-10-CM | POA: Diagnosis not present

## 2023-02-09 DIAGNOSIS — M9906 Segmental and somatic dysfunction of lower extremity: Secondary | ICD-10-CM | POA: Diagnosis not present

## 2023-02-28 DIAGNOSIS — D518 Other vitamin B12 deficiency anemias: Secondary | ICD-10-CM | POA: Diagnosis not present

## 2023-03-22 DIAGNOSIS — F3342 Major depressive disorder, recurrent, in full remission: Secondary | ICD-10-CM | POA: Diagnosis not present

## 2023-03-22 DIAGNOSIS — F5101 Primary insomnia: Secondary | ICD-10-CM | POA: Diagnosis not present

## 2023-03-31 DIAGNOSIS — L258 Unspecified contact dermatitis due to other agents: Secondary | ICD-10-CM | POA: Diagnosis not present

## 2023-03-31 DIAGNOSIS — L308 Other specified dermatitis: Secondary | ICD-10-CM | POA: Diagnosis not present

## 2023-03-31 DIAGNOSIS — L820 Inflamed seborrheic keratosis: Secondary | ICD-10-CM | POA: Diagnosis not present

## 2023-04-04 DIAGNOSIS — D518 Other vitamin B12 deficiency anemias: Secondary | ICD-10-CM | POA: Diagnosis not present

## 2023-04-14 ENCOUNTER — Other Ambulatory Visit: Payer: Self-pay | Admitting: Family Medicine

## 2023-04-14 DIAGNOSIS — E039 Hypothyroidism, unspecified: Secondary | ICD-10-CM

## 2023-05-02 DIAGNOSIS — D518 Other vitamin B12 deficiency anemias: Secondary | ICD-10-CM | POA: Diagnosis not present

## 2023-05-11 ENCOUNTER — Other Ambulatory Visit: Payer: Self-pay | Admitting: Family Medicine

## 2023-05-11 DIAGNOSIS — E039 Hypothyroidism, unspecified: Secondary | ICD-10-CM

## 2023-05-30 DIAGNOSIS — D518 Other vitamin B12 deficiency anemias: Secondary | ICD-10-CM | POA: Diagnosis not present

## 2023-06-20 ENCOUNTER — Other Ambulatory Visit: Payer: Self-pay | Admitting: Family Medicine

## 2023-06-20 DIAGNOSIS — J302 Other seasonal allergic rhinitis: Secondary | ICD-10-CM

## 2023-07-11 DIAGNOSIS — D518 Other vitamin B12 deficiency anemias: Secondary | ICD-10-CM | POA: Diagnosis not present

## 2023-07-12 DIAGNOSIS — F3342 Major depressive disorder, recurrent, in full remission: Secondary | ICD-10-CM | POA: Diagnosis not present

## 2023-07-12 DIAGNOSIS — F5101 Primary insomnia: Secondary | ICD-10-CM | POA: Diagnosis not present

## 2023-08-09 DIAGNOSIS — M25571 Pain in right ankle and joints of right foot: Secondary | ICD-10-CM | POA: Diagnosis not present

## 2023-09-01 DIAGNOSIS — M25571 Pain in right ankle and joints of right foot: Secondary | ICD-10-CM | POA: Diagnosis not present

## 2023-09-01 DIAGNOSIS — R262 Difficulty in walking, not elsewhere classified: Secondary | ICD-10-CM | POA: Diagnosis not present

## 2023-09-12 DIAGNOSIS — D518 Other vitamin B12 deficiency anemias: Secondary | ICD-10-CM | POA: Diagnosis not present

## 2023-09-12 DIAGNOSIS — Z23 Encounter for immunization: Secondary | ICD-10-CM | POA: Diagnosis not present

## 2023-10-10 DIAGNOSIS — D518 Other vitamin B12 deficiency anemias: Secondary | ICD-10-CM | POA: Diagnosis not present

## 2023-10-13 ENCOUNTER — Encounter: Payer: Self-pay | Admitting: Family Medicine

## 2023-10-13 ENCOUNTER — Ambulatory Visit: Payer: BC Managed Care – PPO | Admitting: Family Medicine

## 2023-10-13 VITALS — BP 104/72 | HR 56 | Temp 98.3°F | Ht 62.0 in | Wt 131.2 lb

## 2023-10-13 DIAGNOSIS — F1010 Alcohol abuse, uncomplicated: Secondary | ICD-10-CM | POA: Diagnosis not present

## 2023-10-13 DIAGNOSIS — E538 Deficiency of other specified B group vitamins: Secondary | ICD-10-CM | POA: Diagnosis not present

## 2023-10-13 DIAGNOSIS — E039 Hypothyroidism, unspecified: Secondary | ICD-10-CM | POA: Diagnosis not present

## 2023-10-13 DIAGNOSIS — G4700 Insomnia, unspecified: Secondary | ICD-10-CM

## 2023-10-13 MED ORDER — LEVOTHYROXINE SODIUM 100 MCG PO TABS: 100.0000 ug | ORAL_TABLET | Freq: Every day | ORAL | 0 refills | Status: DC

## 2023-10-13 NOTE — Progress Notes (Signed)
Established Patient Office Visit   Subjective  Patient ID: Laura Howell, female    DOB: [birth year]  Age: 62 y.o. MRN: [id number]  Chief Complaint  Patient presents with   Medication Refill    B-12 shot     Patient is a 62 year old female seen for follow-up.  Patient requesting refills.  Patient states she has been doing well overall.  Interested in rechecking labs.  Concerned about drinking.  States having 3 glasses of wine per night.  Has tried to cut down but found it difficult.  States drinking is not affecting work or relationships.  Tried naltrexone but was taking it before bed which is after she drinks.  States caused nightmares.    Patient Active Problem List   Diagnosis Date Noted   Elevated LFTs 04/15/2020   Alcohol use 04/15/2020   Acquired hypothyroidism 06/17/2007   Depression, recurrent (HCC) 06/17/2007   Past Medical History:  Diagnosis Date   B12 deficiency    Depression    Thyroid disease    Past Surgical History:  Procedure Laterality Date   LAPAROSCOPY  1997   endrometriosis   Social History   Tobacco Use   Smoking status: Never   Smokeless tobacco: Never  Vaping Use   Vaping status: Never Used  Substance Use Topics   Alcohol use: Yes    Comment: 2x/week   Drug use: Never   Family History  Problem Relation Age of Onset   Cancer Father        Lung cancer   Heart disease Father    Heart disease Maternal Grandfather    Heart disease Paternal Grandfather    Allergies  Allergen Reactions   Erythromycin Rash   Tetracyclines & Related Rash      ROS Negative unless stated above    Objective:     BP 104/72 (BP Location: Right Arm, Patient Position: Sitting, Cuff Size: Normal)   Pulse (!) 56   Temp 98.3 F (36.8 C) (Oral)   Ht 5\' 2"  (1.575 m)   Wt 131 lb 3.2 oz (59.5 kg)   SpO2 97%   BMI 24.00 kg/m  BP Readings from Last 3 Encounters:  10/13/23 104/72  12/23/22 100/60  11/11/22 122/76   Wt Readings from Last 3 Encounters:   10/13/23 131 lb 3.2 oz (59.5 kg)  12/23/22 120 lb 3.2 oz (54.5 kg)  11/11/22 123 lb 12.8 oz (56.2 kg)      Physical Exam Constitutional:      General: She is not in acute distress.    Appearance: Normal appearance.  HENT:     Head: Normocephalic and atraumatic.     Nose: Nose normal.     Mouth/Throat:     Mouth: Mucous membranes are moist.  Cardiovascular:     Rate and Rhythm: Normal rate and regular rhythm.     Heart sounds: Normal heart sounds. No murmur heard.    No gallop.  Pulmonary:     Effort: Pulmonary effort is normal. No respiratory distress.     Breath sounds: Normal breath sounds. No wheezing, rhonchi or rales.  Abdominal:     General: Bowel sounds are normal.     Palpations: Abdomen is soft.     Tenderness: There is no abdominal tenderness. There is no guarding or rebound.  Skin:    General: Skin is warm and dry.  Neurological:     Mental Status: She is alert and oriented to person, place, and time.    No  results found for any visits on 10/13/23. Flowsheet Row Office Visit from 10/13/2023 in [facility] [place]  AUDIT-C Score 5        Assessment & Plan:  ETOH abuse -     Comprehensive metabolic panel; Future -     CBC with Differential/Platelet; Future -     TSH; Future -     Vitamin B12; Future -     Folate; Future  Insomnia, unspecified type -     TSH; Future  Acquired hypothyroidism -     Levothyroxine Sodium; Take 1 tablet (100 mcg total) by mouth daily before breakfast.  Dispense: 30 tablet; Refill: 0 -     TSH; Future  Vitamin B12 deficiency -     Vitamin B12; Future  Unable to obtain labs in clinic this visit due to staffing.  Patient to return to clinic next week for labs.  Discussed working to cut down from 3 drinks per night to 2.  Naltrexone DC'd 2/2 causing nightmares.  Consider Antabuse or Topamax for alcohol cessation.  Continue f/u with [facility].  Return in about 4 weeks (around 11/10/2023).   Deeann Saint, MD

## 2023-11-05 ENCOUNTER — Other Ambulatory Visit: Payer: Self-pay | Admitting: Family Medicine

## 2023-11-05 DIAGNOSIS — E039 Hypothyroidism, unspecified: Secondary | ICD-10-CM

## 2023-11-09 DIAGNOSIS — F439 Reaction to severe stress, unspecified: Secondary | ICD-10-CM | POA: Diagnosis not present

## 2023-11-09 DIAGNOSIS — F5101 Primary insomnia: Secondary | ICD-10-CM | POA: Diagnosis not present

## 2023-11-09 DIAGNOSIS — F3342 Major depressive disorder, recurrent, in full remission: Secondary | ICD-10-CM | POA: Diagnosis not present

## 2023-11-18 ENCOUNTER — Other Ambulatory Visit (INDEPENDENT_AMBULATORY_CARE_PROVIDER_SITE_OTHER): Payer: BC Managed Care – PPO

## 2023-11-18 DIAGNOSIS — G4700 Insomnia, unspecified: Secondary | ICD-10-CM | POA: Diagnosis not present

## 2023-11-18 DIAGNOSIS — E039 Hypothyroidism, unspecified: Secondary | ICD-10-CM

## 2023-11-18 DIAGNOSIS — F1010 Alcohol abuse, uncomplicated: Secondary | ICD-10-CM

## 2023-11-18 DIAGNOSIS — E538 Deficiency of other specified B group vitamins: Secondary | ICD-10-CM

## 2023-11-19 LAB — CBC WITH DIFFERENTIAL/PLATELET
Basophils Absolute: 0.1 10*3/uL (ref 0.0–0.1)
Basophils Relative: 1.2 % (ref 0.0–3.0)
Eosinophils Absolute: 0.1 10*3/uL (ref 0.0–0.7)
Eosinophils Relative: 1.7 % (ref 0.0–5.0)
HCT: 42.9 % (ref 36.0–46.0)
Hemoglobin: 14.2 g/dL (ref 12.0–15.0)
Lymphocytes Relative: 31.7 % (ref 12.0–46.0)
Lymphs Abs: 1.8 10*3/uL (ref 0.7–4.0)
MCHC: 33.1 g/dL (ref 30.0–36.0)
MCV: 106.6 fL — ABNORMAL HIGH (ref 78.0–100.0)
Monocytes Absolute: 0.8 10*3/uL (ref 0.1–1.0)
Monocytes Relative: 14.3 % — ABNORMAL HIGH (ref 3.0–12.0)
Neutro Abs: 3 10*3/uL (ref 1.4–7.7)
Neutrophils Relative %: 51.1 % (ref 43.0–77.0)
Platelets: 335 10*3/uL (ref 150.0–400.0)
RBC: 4.03 Mil/uL (ref 3.87–5.11)
RDW: 12.4 % (ref 11.5–15.5)
WBC: 5.8 10*3/uL (ref 4.0–10.5)

## 2023-11-19 LAB — TSH: TSH: 5.68 u[IU]/mL — ABNORMAL HIGH (ref 0.35–5.50)

## 2023-11-19 LAB — COMPREHENSIVE METABOLIC PANEL
ALT: 20 U/L (ref 0–35)
AST: 28 U/L (ref 0–37)
Albumin: 4.3 g/dL (ref 3.5–5.2)
Alkaline Phosphatase: 69 U/L (ref 39–117)
BUN: 11 mg/dL (ref 6–23)
CO2: 30 meq/L (ref 19–32)
Calcium: 9.8 mg/dL (ref 8.4–10.5)
Chloride: 103 meq/L (ref 96–112)
Creatinine, Ser: 0.89 mg/dL (ref 0.40–1.20)
GFR: 70.15 mL/min (ref >60.00)
Glucose, Bld: 78 mg/dL (ref 70–99)
Potassium: 4.1 meq/L (ref 3.5–5.1)
Sodium: 139 meq/L (ref 135–145)
Total Bilirubin: 0.6 mg/dL (ref 0.2–1.2)
Total Protein: 6.8 g/dL (ref 6.0–8.3)

## 2023-11-19 LAB — FOLATE: Folate: 21.1 ng/mL (ref >5.9)

## 2023-11-19 LAB — VITAMIN B12: Vitamin B-12: 242 pg/mL (ref 211–911)

## 2023-11-30 ENCOUNTER — Telehealth: Payer: Self-pay | Admitting: Family Medicine

## 2023-11-30 NOTE — Telephone Encounter (Signed)
Called patient and left a VM to return call, AVS states Dr. Salomon Fick want the patient to return in 4 week.

## 2023-11-30 NOTE — Telephone Encounter (Signed)
Prescription Request  11/30/2023  LOV: 10/13/2023  What is the name of the medication or equipment? Levothyroxine. Pt states she was told to have lab work done in order to have medications refilled. She had her labs done on 11/21 and would like her medications refilled.   Have you contacted your pharmacy to request a refill? No   Which pharmacy would you like this sent to?  [facility] - [place], [place] - [address] [place] [address] Phone: [phone] Fax: [phone]   Patient notified that their request is being sent to the clinical staff for review and that they should receive a response within 2 business days.   Please advise at Mobile [phone] (mobile)

## 2023-12-03 ENCOUNTER — Other Ambulatory Visit: Payer: Self-pay | Admitting: Family Medicine

## 2023-12-03 DIAGNOSIS — E039 Hypothyroidism, unspecified: Secondary | ICD-10-CM

## 2023-12-03 MED ORDER — LEVOTHYROXINE SODIUM 112 MCG PO TABS: 112.0000 ug | ORAL_TABLET | Freq: Every day | ORAL | 3 refills | Status: DC

## 2023-12-03 NOTE — Telephone Encounter (Signed)
[facility] [address]

## 2023-12-05 DIAGNOSIS — D518 Other vitamin B12 deficiency anemias: Secondary | ICD-10-CM | POA: Diagnosis not present

## 2023-12-06 NOTE — Telephone Encounter (Signed)
Medical sent to patient's mail order pharmacy at increased dose given lab results.

## 2023-12-15 ENCOUNTER — Ambulatory Visit: Payer: BC Managed Care – PPO | Admitting: Family Medicine

## 2023-12-15 ENCOUNTER — Encounter: Payer: Self-pay | Admitting: Family Medicine

## 2023-12-15 VITALS — BP 122/78 | HR 59 | Temp 98.3°F | Ht 62.0 in | Wt 134.2 lb

## 2023-12-15 DIAGNOSIS — L409 Psoriasis, unspecified: Secondary | ICD-10-CM | POA: Diagnosis not present

## 2023-12-15 DIAGNOSIS — Z1211 Encounter for screening for malignant neoplasm of colon: Secondary | ICD-10-CM

## 2023-12-15 DIAGNOSIS — L821 Other seborrheic keratosis: Secondary | ICD-10-CM

## 2023-12-15 DIAGNOSIS — Z789 Other specified health status: Secondary | ICD-10-CM

## 2023-12-15 DIAGNOSIS — Z13220 Encounter for screening for lipoid disorders: Secondary | ICD-10-CM

## 2023-12-15 DIAGNOSIS — Z1231 Encounter for screening mammogram for malignant neoplasm of breast: Secondary | ICD-10-CM

## 2023-12-15 MED ORDER — CLOBETASOL PROPIONATE 0.05 % EX OINT: 1.0000 | TOPICAL_OINTMENT | Freq: Two times a day (BID) | CUTANEOUS | 1 refills | Status: DC

## 2023-12-15 NOTE — Progress Notes (Signed)
Established Patient Office Visit   Subjective  Patient ID: Laura Howell, female    DOB: 14-May-1961  Age: 62 y.o. MRN: [id number]  Chief Complaint  Patient presents with   Medical Management of Chronic Issues    Follow-up, moles on the back and bilateral leg rash     Pt is a 62 yo female seen for acute concern.  Pt noticing a mildly pruritic rash on b/l LEs.  Areas started off as erythematous, slightly raised almost flaky spots that become light brown and flat.  Denies changes in soaps, lotions, foods, or detergents.  No recent travel.  No one else in household with similar symptoms.    Patient also mentions moles on back.  Patient drinking about the same amount of wine each night.  Working on cutting down.  Drinking has not affected her life.  Others have not commented about her drinking.    Patient does not feel comfortable giving herself cyanocobalamine injections.  [facility] [address], however unable to use her own prescription vial.  Pt inquires why dose of synthroid was changed to 112 mcg.  Advised change made due to elevated TSH (5.68) on 11/18/23.  Patient would like to set up colonoscopy and mammogram.    Patient Active Problem List   Diagnosis Date Noted   Elevated LFTs 04/15/2020   Alcohol use 04/15/2020   Acquired hypothyroidism 06/17/2007   Depression, recurrent (HCC) 06/17/2007   Past Medical History:  Diagnosis Date   B12 deficiency    Depression    Thyroid disease    Past Surgical History:  Procedure Laterality Date   LAPAROSCOPY  1997   endrometriosis   Social History   Tobacco Use   Smoking status: Never   Smokeless tobacco: Never  Vaping Use   Vaping status: Never Used  Substance Use Topics   Alcohol use: Yes    Comment: 2x/week   Drug use: Never   Family History  Problem Relation Age of Onset   Cancer Father        Lung cancer   Heart disease Father    Heart disease Maternal Grandfather    Heart  disease Paternal Grandfather    Allergies  Allergen Reactions   Erythromycin Rash   Tetracyclines & Related Rash      ROS Negative unless stated above    Objective:     BP 122/78 (BP Location: Left Arm, Patient Position: Sitting, Cuff Size: Normal)   Pulse (!) 59   Temp 98.3 F (36.8 C) (Oral)   Ht 5\' 2"  (1.575 m)   Wt 134 lb 3.2 oz (60.9 kg)   SpO2 98%   BMI 24.55 kg/m  BP Readings from Last 3 Encounters:  12/15/23 122/78  10/13/23 104/72  12/23/22 100/60   Wt Readings from Last 3 Encounters:  12/15/23 134 lb 3.2 oz (60.9 kg)  10/13/23 131 lb 3.2 oz (59.5 kg)  12/23/22 120 lb 3.2 oz (54.5 kg)      Physical Exam Constitutional:      General: She is not in acute distress.    Appearance: Normal appearance.  HENT:     Head: Normocephalic and atraumatic.     Nose: Nose normal.     Mouth/Throat:     Mouth: Mucous membranes are moist.  Cardiovascular:     Rate and Rhythm: Normal rate and regular rhythm.     Heart sounds: Normal heart sounds. No murmur heard.    No gallop.  Pulmonary:     Effort: Pulmonary effort is normal. No respiratory distress.     Breath sounds: Normal breath sounds. No wheezing, rhonchi or rales.  Skin:    General: Skin is warm and dry.     Comments: Dry, scaly appearing, circumscribed, erythematous plaques on extremities.  Several mildly hyperpigmented circumscribed flat lesions on extremities.  Hyperpigmented stuck on appearing areas of upper/mid back.   Neurological:     Mental Status: She is alert and oriented to person, place, and time.      No results found for any visits on 12/15/23.    Assessment & Plan:  Psoriasis -     Clobetasol Propionate; Apply 1 Application topically 2 (two) times daily.  Dispense: 60 g; Refill: 1  Seborrheic keratosis  Alcohol use  Colon cancer screening -     Ambulatory referral to Gastroenterology  Encounter for screening mammogram for malignant neoplasm of breast -     3D Screening  Mammogram, Left and Right; Future  Screening for cholesterol level -     Lipid panel; Future  Acute rash psoriatic in appearance.  Start clobetasol ointment.  Larger hyperpigmented areas on back appear to be seborrheic keratosis.  Discussed removal options if desired.  Given info.    Continued EtOH use nightly.  Discussed cutting down.  LFTs normal on 11/18/23.  Discussed various medication options to help with cessation.  Pt willing to consider.  Also advised to continue vit B12 supplement.  Breast cancer and colon cancer screening ordered.  Repeat lipids in the next few months when fasting.    Return in about 6 weeks (around 01/26/2024).   Deeann Saint, MD

## 2023-12-15 NOTE — Patient Instructions (Addendum)
Your liver function was normal when it was checked on 11/18/2023.  Your TSH, measure of thyroid function was slightly elevated.  This is why your dose of Synthroid was adjusted.  We can recheck that level at least 4-6 weeks after you have been taking the increased dose consistently.  The orders for the labs have already been placed.  It was also recommended that you take an over-the-counter vitamin B12 supplement of at least 1000-2000 IUs daily as your B12 was low normal.  Your B12 can decrease due to alcohol consumption.  Continue to work on decreasing your alcohol intake.  A prescription for steroid cream was also sent to your pharmacy to help with the plaques on your skin.  Information about psoriasis was included for you to review.

## 2024-01-12 ENCOUNTER — Other Ambulatory Visit: Payer: Self-pay | Admitting: Family Medicine

## 2024-01-12 DIAGNOSIS — J302 Other seasonal allergic rhinitis: Secondary | ICD-10-CM

## 2024-01-24 ENCOUNTER — Ambulatory Visit: Payer: Self-pay | Admitting: Family Medicine

## 2024-01-24 NOTE — Telephone Encounter (Signed)
Copied from CRM [id number]. Topic: Clinical - Medication Question >> Jan 24, 2024  1:56 PM Irine Seal wrote: Reason for CRM: Pts husband Lorin Picket stated that at the time of her last lab work, she had been without her thyroid medication for 3 weeks, so she thinks that is why her levels were elevated, since then patient feels like her dosage is too high and she would like to schedule lab work. And discuss her levothyroxine (SYNTHROID) 112 MCG tablet [[id number]] callback [phone]

## 2024-01-26 ENCOUNTER — Telehealth: Payer: Self-pay

## 2024-01-26 NOTE — Telephone Encounter (Signed)
Copied from CRM [id number]. Topic: Clinical - Medical Advice >> Jan 26, 2024  1:56 PM Almira Coaster wrote: Reason for CRM: Patient's husband is calling to follow up on his message from yesterday regarding lab work to check her thyroid . He would like a call back number [phone].

## 2024-01-27 NOTE — Telephone Encounter (Signed)
Called patient's phone number unable to left a VM, called husband and left a VM to call back, I am unable to speak with husband without DPR, patient would need to call and sch an appt with Dr. Salomon Fick

## 2024-02-03 ENCOUNTER — Ambulatory Visit: Payer: BC Managed Care – PPO | Admitting: Family Medicine

## 2024-02-13 DIAGNOSIS — D518 Other vitamin B12 deficiency anemias: Secondary | ICD-10-CM | POA: Diagnosis not present

## 2024-02-13 DIAGNOSIS — Z789 Other specified health status: Secondary | ICD-10-CM | POA: Diagnosis not present

## 2024-02-14 NOTE — Telephone Encounter (Signed)
Orders for labs were previously placed.  Unable to accurately tell if medication dose was "too high" as pt stopped taking it.  Also unsure if patient was having any symptoms to indicate dose was high.  Will need pt to restart medication and take consistently for at least 4 weeks before rechecking levels/schedule follow-up appointment.

## 2024-02-15 NOTE — Telephone Encounter (Signed)
Called and spoke with Lorin Picket (husband) per DPR. Patient is still taking medication. Patient will call to sch follow-up appt

## 2024-02-22 DIAGNOSIS — F5101 Primary insomnia: Secondary | ICD-10-CM | POA: Diagnosis not present

## 2024-02-22 DIAGNOSIS — F439 Reaction to severe stress, unspecified: Secondary | ICD-10-CM | POA: Diagnosis not present

## 2024-02-22 DIAGNOSIS — F3342 Major depressive disorder, recurrent, in full remission: Secondary | ICD-10-CM | POA: Diagnosis not present

## 2024-02-23 ENCOUNTER — Ambulatory Visit
Admission: RE | Admit: 2024-02-23 | Discharge: 2024-02-23 | Disposition: A | Discharge status: Home / Self Care | Payer: BC Managed Care – PPO | Source: Ambulatory Visit | Attending: Family Medicine | Admitting: Family Medicine

## 2024-02-23 DIAGNOSIS — Z1231 Encounter for screening mammogram for malignant neoplasm of breast: Secondary | ICD-10-CM | POA: Diagnosis not present

## 2024-03-12 DIAGNOSIS — D518 Other vitamin B12 deficiency anemias: Secondary | ICD-10-CM | POA: Diagnosis not present

## 2024-03-21 ENCOUNTER — Other Ambulatory Visit: Payer: Self-pay | Admitting: Family Medicine

## 2024-03-21 DIAGNOSIS — E039 Hypothyroidism, unspecified: Secondary | ICD-10-CM

## 2024-04-09 DIAGNOSIS — D518 Other vitamin B12 deficiency anemias: Secondary | ICD-10-CM | POA: Diagnosis not present

## 2024-05-07 DIAGNOSIS — D518 Other vitamin B12 deficiency anemias: Secondary | ICD-10-CM | POA: Diagnosis not present

## 2024-05-31 DIAGNOSIS — F3342 Major depressive disorder, recurrent, in full remission: Secondary | ICD-10-CM | POA: Diagnosis not present

## 2024-05-31 DIAGNOSIS — F5101 Primary insomnia: Secondary | ICD-10-CM | POA: Diagnosis not present

## 2024-06-09 ENCOUNTER — Other Ambulatory Visit: Payer: Self-pay | Admitting: Family Medicine

## 2024-06-09 NOTE — Telephone Encounter (Unsigned)
 Copied from CRM [id number]. Topic: Clinical - Medication Refill >> Jun 09, 2024  3:16 PM Martinique E wrote: Medication: cyanocobalamin  (VITAMIN B12) 1000 MCG/ML injection  Has the patient contacted their pharmacy? No (Agent: If no, request that the patient contact the pharmacy for the refill. If patient does not wish to contact the pharmacy document the reason why and proceed with request.) (Agent: If yes, when and what did the pharmacy advise?)  This is the patient's preferred pharmacy:   [facility]  [address] [place], [place] [address] Phone: [phone]  Is this the correct pharmacy for this prescription? Yes If no, delete pharmacy and type the correct one.   Has the prescription been filled recently? No  Is the patient out of the medication? Yes  Has the patient been seen for an appointment in the last year OR does the patient have an upcoming appointment? Yes  Can we respond through MyChart? Yes  Agent: Please be advised that Rx refills may take up to 3 business days. We ask that you follow-up with your pharmacy.

## 2024-07-05 ENCOUNTER — Other Ambulatory Visit: Payer: Self-pay | Admitting: Family Medicine

## 2024-07-05 ENCOUNTER — Telehealth: Payer: Self-pay | Admitting: Family Medicine

## 2024-07-05 DIAGNOSIS — J302 Other seasonal allergic rhinitis: Secondary | ICD-10-CM

## 2024-07-05 NOTE — Telephone Encounter (Signed)
 Called an spoke with scott per DPR, patient will get labs drawn the day of appt, fasting 8 hrs before

## 2024-07-05 NOTE — Telephone Encounter (Signed)
 Pt has cpe sch for 07/20/2024 and would like labs in advance

## 2024-07-05 NOTE — Telephone Encounter (Signed)
 Copied from CRM [id number]. Topic: Clinical - Request for Lab/Test Order >> Jul 05, 2024 12:14 PM Larissa RAMAN wrote: Reason for CRM: Patient requesting to have labs scheduled for physical. Request callback to spouse, Aymar Whitfill, at [phone].

## 2024-07-06 ENCOUNTER — Other Ambulatory Visit: Payer: Self-pay | Admitting: Family Medicine

## 2024-07-06 NOTE — Telephone Encounter (Signed)
 Copied from CRM [id number]. Topic: Clinical - Medication Refill >> Jul 06, 2024  4:37 PM Lavanda D wrote: Medication: Patient's husband is requesting an order for cyanocobalamin  (VITAMIN B12) 1000 MCG/ML injection [[id number]], previously requested and denied for reason: needs appt. She is now scheduled for 6/24 and is [facility] [address]  Has the patient contacted their pharmacy? Yes, needs order (Agent: If no, request that the patient contact the pharmacy for the refill. If patient does not wish to contact the pharmacy document the reason why and proceed with request.) (Agent: If yes, when and what did the pharmacy advise?)  [id number] [place], [place] [facility] [address] [place] [address] Phone: [phone] Fax: [phone]  Is this the correct pharmacy for this prescription? Yes If no, delete pharmacy and type the correct one.   Has the prescription been filled recently? No  Is the patient out of the medication? Yes  Has the patient been seen for an appointment in the last year OR does the patient have an upcoming appointment? Yes  Can we respond through MyChart? Yes  Agent: Please be advised that Rx refills may take up to 3 business days. We ask that you follow-up with your pharmacy.

## 2024-07-12 ENCOUNTER — Telehealth: Payer: Self-pay

## 2024-07-12 MED ORDER — CYANOCOBALAMIN 1000 MCG/ML IJ SOLN: INTRAMUSCULAR | 3 refills | Status: DC

## 2024-07-12 NOTE — Telephone Encounter (Signed)
 Copied from CRM [id number]. Topic: Clinical - Medication Question >> Jul 12, 2024  9:18 AM Thersia BROCKS wrote: Reason for CRM: Patient called in regarding the prescription  cyanocobalamin  (VITAMIN B12) 1000 MCG/ML injection , stated she will need it soon and would like it to be picked up so she can  Stated doesn't need the prescription sent to the pharmacy needs it pick up [facility]  Please give patient a callback today regarding this

## 2024-07-17 DIAGNOSIS — D518 Other vitamin B12 deficiency anemias: Secondary | ICD-10-CM | POA: Diagnosis not present

## 2024-07-20 ENCOUNTER — Encounter: Payer: Self-pay | Admitting: Family Medicine

## 2024-07-20 ENCOUNTER — Ambulatory Visit: Admitting: Family Medicine

## 2024-07-20 VITALS — BP 118/80 | HR 55 | Temp 98.0°F | Ht 62.0 in | Wt 134.0 lb

## 2024-07-20 DIAGNOSIS — E039 Hypothyroidism, unspecified: Secondary | ICD-10-CM | POA: Diagnosis not present

## 2024-07-20 DIAGNOSIS — L853 Xerosis cutis: Secondary | ICD-10-CM

## 2024-07-20 DIAGNOSIS — Z0000 Encounter for general adult medical examination without abnormal findings: Secondary | ICD-10-CM

## 2024-07-20 DIAGNOSIS — E538 Deficiency of other specified B group vitamins: Secondary | ICD-10-CM | POA: Diagnosis not present

## 2024-07-20 DIAGNOSIS — F1010 Alcohol abuse, uncomplicated: Secondary | ICD-10-CM | POA: Diagnosis not present

## 2024-07-20 DIAGNOSIS — R7989 Other specified abnormal findings of blood chemistry: Secondary | ICD-10-CM

## 2024-07-20 DIAGNOSIS — Z13220 Encounter for screening for lipoid disorders: Secondary | ICD-10-CM | POA: Diagnosis not present

## 2024-07-20 DIAGNOSIS — Z1211 Encounter for screening for malignant neoplasm of colon: Secondary | ICD-10-CM

## 2024-07-20 DIAGNOSIS — L821 Other seborrheic keratosis: Secondary | ICD-10-CM

## 2024-07-20 NOTE — Progress Notes (Signed)
 Established Patient Office Visit   Subjective  Patient ID: Laura Howell, female    DOB: Jan 16, 1961  Age: 63 y.o. MRN: [id number]  Chief Complaint  Patient presents with   Annual Exam    Patient is a 63 year old female seen for CPE.  Requesting refill on vit B12 inj on x yrs.  [facility].  Unable to use large vial as they will not reuse it.  Pt still drinking EtOH. States drinking a beer and 2 glasses of wine each evening. Concerned about liver function.  Wants to quit.  Does not want to have to participate in group counseling or inpatient.  Does not want other ppl to know about her problem.  Requesting referral to dermatology for skin concerns.    Patient Active Problem List   Diagnosis Date Noted   Elevated LFTs 04/15/2020   Alcohol use 04/15/2020   Acquired hypothyroidism 06/17/2007   Depression, recurrent (HCC) 06/17/2007   Past Medical History:  Diagnosis Date   B12 deficiency    Depression    Thyroid  disease    Past Surgical History:  Procedure Laterality Date   LAPAROSCOPY  1997   endrometriosis   Social History   Tobacco Use   Smoking status: Never   Smokeless tobacco: Never  Vaping Use   Vaping status: Never Used  Substance Use Topics   Alcohol use: Yes    Comment: 2x/week   Drug use: Never   Family History  Problem Relation Age of Onset   Cancer Father        Lung cancer   Heart disease Father    Heart disease Maternal Grandfather    Heart disease Paternal Grandfather    Allergies  Allergen Reactions   Erythromycin Rash   Tetracyclines & Related Rash    ROS Negative unless stated above    Objective:     BP 118/80 (BP Location: Left Arm, Patient Position: Sitting, Cuff Size: Normal)   Pulse (!) 55   Temp 98 F (36.7 C) (Oral)   Ht 5' 2 (1.575 m)   Wt 134 lb (60.8 kg)   SpO2 98%   BMI 24.51 kg/m  BP Readings from Last 3 Encounters:  07/20/24 118/80  12/15/23 122/78  10/13/23 104/72    Wt Readings from Last 3 Encounters:  07/20/24 134 lb (60.8 kg)  12/15/23 134 lb 3.2 oz (60.9 kg)  10/13/23 131 lb 3.2 oz (59.5 kg)      Physical Exam Constitutional:      Appearance: Normal appearance.  HENT:     Head: Normocephalic and atraumatic.     Right Ear: Tympanic membrane, ear canal and external ear normal.     Left Ear: Tympanic membrane, ear canal and external ear normal.     Nose: Nose normal.     Mouth/Throat:     Mouth: Mucous membranes are moist.     Pharynx: No oropharyngeal exudate or posterior oropharyngeal erythema.  Eyes:     General: No scleral icterus.    Extraocular Movements: Extraocular movements intact.     Conjunctiva/sclera: Conjunctivae normal.     Pupils: Pupils are equal, round, and reactive to light.  Neck:     Thyroid : No thyromegaly.  Cardiovascular:     Rate and Rhythm: Normal rate and regular rhythm.     Pulses: Normal pulses.     Heart sounds: Normal heart sounds. No murmur heard.    No friction rub.  Pulmonary:  Effort: Pulmonary effort is normal.     Breath sounds: Normal breath sounds. No wheezing, rhonchi or rales.  Chest:  Breasts:    Breasts are symmetrical.     Right: Normal.     Left: Normal.  Abdominal:     General: Bowel sounds are normal.     Palpations: Abdomen is soft.     Tenderness: There is no abdominal tenderness.  Musculoskeletal:        General: No deformity. Normal range of motion.  Lymphadenopathy:     Cervical: No cervical adenopathy.  Skin:    General: Skin is warm and dry.     Findings: No lesion.     Comments: Seborrheic keratosis on back.   Neurological:     General: No focal deficit present.     Mental Status: She is alert and oriented to person, place, and time.  Psychiatric:        Mood and Affect: Mood normal.        Thought Content: Thought content normal.        07/20/2024    5:05 PM 12/15/2023    4:01 PM 12/23/2022    4:17 PM  Depression screen PHQ 2/9  Decreased Interest  0 2   Down, Depressed, Hopeless 0 0 3  PHQ - 2 Score 0 0 5  Altered sleeping 0 0 0  Tired, decreased energy 1 0 3  Change in appetite 0 0 0  Feeling bad or failure about yourself  1 0 2  Trouble concentrating 0 0 2  Moving slowly or fidgety/restless 0 0 0  Suicidal thoughts 0 0 0  PHQ-9 Score 2 0 12  Difficult doing work/chores Not difficult at all Not difficult at all Very difficult   No results found for any visits on 07/20/24.    Assessment & Plan:   Well adult exam -     CBC with Differential/Platelet; Future -     Comprehensive metabolic panel with GFR; Future -     Lipid panel; Future -     Hemoglobin A1c; Future  Acquired hypothyroidism -     TSH; Future  ETOH abuse -     CBC with Differential/Platelet; Future -     Comprehensive metabolic panel with GFR; Future -     Vitamin B12; Future -     Hemoglobin A1c; Future -     Folate; Future  Vitamin B12 deficiency -     Vitamin B12; Future  Dry skin -     Ambulatory referral to Dermatology  Seborrheic keratosis -     Ambulatory referral to Dermatology  Colon cancer screening -     Ambulatory referral to Gastroenterology  Health screenings discussed.  Obtain labs.  Immunizations reviewed.  Consider shingles vaccine.  Declines at this time.  Consider pneumonia vaccine.  Mammogram up-to-date done 02/23/2024.  Patient encouraged to consider colonoscopy.  Referral placed.  Next CPE in 1 year.  Reviewed risk associated with EtOH consumption.  Patient interested in quitting drinking.  Given information to area alcohol programs.  Discussed medication options.  Reluctant to consider a day program or group counseling.  Patient encouraged to cut down alcohol intake daily.  Advised not to stop cold [place].  Given strict precautions.  Obtain vitamin B12 level at this visit.  Prescription previously sent to pharmacy.  Return in about 3 months (around 10/20/2024).   Laura JONELLE Single, MD

## 2024-07-21 LAB — COMPREHENSIVE METABOLIC PANEL WITH GFR
ALT: 40 U/L — ABNORMAL HIGH (ref 0–35)
AST: 46 U/L — ABNORMAL HIGH (ref 0–37)
Albumin: 4.2 g/dL (ref 3.5–5.2)
Alkaline Phosphatase: 65 U/L (ref 39–117)
BUN: 12 mg/dL (ref 6–23)
CO2: 28 meq/L (ref 19–32)
Calcium: 9.6 mg/dL (ref 8.4–10.5)
Chloride: 101 meq/L (ref 96–112)
Creatinine, Ser: 0.84 mg/dL (ref 0.40–1.20)
GFR: 74.84 mL/min (ref >60.00)
Glucose, Bld: 75 mg/dL (ref 70–99)
Potassium: 4.7 meq/L (ref 3.5–5.1)
Sodium: 137 meq/L (ref 135–145)
Total Bilirubin: 0.7 mg/dL (ref 0.2–1.2)
Total Protein: 7 g/dL (ref 6.0–8.3)

## 2024-07-21 LAB — CBC WITH DIFFERENTIAL/PLATELET
Basophils Absolute: 0.1 K/uL (ref 0.0–0.1)
Basophils Relative: 1.1 % (ref 0.0–3.0)
Eosinophils Absolute: 0.1 K/uL (ref 0.0–0.7)
Eosinophils Relative: 1.6 % (ref 0.0–5.0)
HCT: 41.5 % (ref 36.0–46.0)
Hemoglobin: 14.2 g/dL (ref 12.0–15.0)
Lymphocytes Relative: 29.1 % (ref 12.0–46.0)
Lymphs Abs: 1.4 K/uL (ref 0.7–4.0)
MCHC: 34.2 g/dL (ref 30.0–36.0)
MCV: 104.1 fl — ABNORMAL HIGH (ref 78.0–100.0)
Monocytes Absolute: 0.7 K/uL (ref 0.1–1.0)
Monocytes Relative: 15.1 % — ABNORMAL HIGH (ref 3.0–12.0)
Neutro Abs: 2.6 K/uL (ref 1.4–7.7)
Neutrophils Relative %: 53.1 % (ref 43.0–77.0)
Platelets: 281 K/uL (ref 150.0–400.0)
RBC: 3.99 Mil/uL (ref 3.87–5.11)
RDW: 12.3 % (ref 11.5–15.5)
WBC: 4.8 K/uL (ref 4.0–10.5)

## 2024-07-21 LAB — LIPID PANEL
Cholesterol: 211 mg/dL — ABNORMAL HIGH (ref 0–200)
HDL: 94.2 mg/dL (ref >39.00)
LDL Cholesterol: 103 mg/dL — ABNORMAL HIGH (ref 0–99)
NonHDL: 116.78
Total CHOL/HDL Ratio: 2
Triglycerides: 70 mg/dL (ref 0.0–149.0)
VLDL: 14 mg/dL (ref 0.0–40.0)

## 2024-07-21 LAB — VITAMIN B12: Vitamin B-12: 613 pg/mL (ref 211–911)

## 2024-07-21 LAB — FOLATE: Folate: 14.4 ng/mL (ref >5.9)

## 2024-07-21 LAB — TSH: TSH: 1.83 u[IU]/mL (ref 0.35–5.50)

## 2024-07-21 LAB — HEMOGLOBIN A1C: Hgb A1c MFr Bld: 5.4 % (ref 4.6–6.5)

## 2024-07-28 ENCOUNTER — Ambulatory Visit: Payer: Self-pay | Admitting: Family Medicine

## 2024-07-28 DIAGNOSIS — R7989 Other specified abnormal findings of blood chemistry: Secondary | ICD-10-CM

## 2024-09-01 ENCOUNTER — Telehealth: Payer: Self-pay

## 2024-09-01 NOTE — Telephone Encounter (Signed)
 Copied from CRM #[id number]. Topic: Clinical - Medical Advice >> Sep 01, 2024 11:25 AM Burnard DEL wrote: Reason for CRM: Patient usually gets  her b12 injection at [facility].Husband called in stating that they are all temporarily closed for now.He would like to know if she could just come to the office to have it done?

## 2024-09-06 NOTE — Telephone Encounter (Signed)
 Called patient left a detailed message per DPR for B12 injections

## 2024-09-06 NOTE — Telephone Encounter (Signed)
 Ok

## 2024-09-08 DIAGNOSIS — L820 Inflamed seborrheic keratosis: Secondary | ICD-10-CM | POA: Diagnosis not present

## 2024-09-08 DIAGNOSIS — D225 Melanocytic nevi of trunk: Secondary | ICD-10-CM | POA: Diagnosis not present

## 2024-09-15 ENCOUNTER — Ambulatory Visit

## 2024-09-15 DIAGNOSIS — E538 Deficiency of other specified B group vitamins: Secondary | ICD-10-CM | POA: Diagnosis not present

## 2024-09-15 MED ORDER — CYANOCOBALAMIN 1000 MCG/ML IJ SOLN
1000.0000 ug | Freq: Once | INTRAMUSCULAR | Status: AC
  Administered 2024-09-15: 1000 ug via INTRAMUSCULAR

## 2024-09-15 NOTE — Progress Notes (Signed)
Per orders of Dr. Jordan, injection of Cyanocobalamin 1000mcg given by Maryjean Corpening A. Patient tolerated injection well.  

## 2024-09-25 DIAGNOSIS — F3342 Major depressive disorder, recurrent, in full remission: Secondary | ICD-10-CM | POA: Diagnosis not present

## 2024-09-25 DIAGNOSIS — F5101 Primary insomnia: Secondary | ICD-10-CM | POA: Diagnosis not present

## 2024-10-06 ENCOUNTER — Encounter: Payer: Self-pay | Admitting: Family Medicine

## 2024-10-20 ENCOUNTER — Encounter: Payer: Self-pay | Admitting: Family Medicine

## 2024-11-19 ENCOUNTER — Other Ambulatory Visit: Payer: Self-pay | Admitting: Family Medicine

## 2024-11-19 DIAGNOSIS — E039 Hypothyroidism, unspecified: Secondary | ICD-10-CM

## 2024-12-28 ENCOUNTER — Other Ambulatory Visit: Payer: Self-pay | Admitting: Family Medicine

## 2024-12-28 DIAGNOSIS — L409 Psoriasis, unspecified: Secondary | ICD-10-CM

## 2024-12-28 DIAGNOSIS — J302 Other seasonal allergic rhinitis: Secondary | ICD-10-CM

## 2025-01-02 NOTE — Telephone Encounter (Signed)
 Called patient left a VM to call the office to sch a follow-up appt per last office visit note with Dr. Mercer,

## 2025-01-05 ENCOUNTER — Telehealth: Payer: Self-pay

## 2025-01-05 NOTE — Telephone Encounter (Signed)
 Copied from CRM #[id number]. Topic: Clinical - Prescription Issue >> Jan 05, 2025  9:41 AM Amy B wrote: Reason for CRM: [facility] was [facility] information.  It did not specify how often she needed to have the injection.  [facility]

## 2025-01-08 ENCOUNTER — Other Ambulatory Visit: Payer: Self-pay | Admitting: Family Medicine

## 2025-01-08 DIAGNOSIS — E538 Deficiency of other specified B group vitamins: Secondary | ICD-10-CM

## 2025-01-08 MED ORDER — CYANOCOBALAMIN 1000 MCG/ML IJ SOLN: 1000.0000 ug | INTRAMUSCULAR | 3 refills | Status: AC

## 2025-01-08 NOTE — Telephone Encounter (Signed)
 Prescription resent. Taking 1000 mcg IM monthly

## 2025-01-16 ENCOUNTER — Telehealth: Payer: Self-pay | Admitting: *Deleted

## 2025-01-16 NOTE — Telephone Encounter (Signed)
 Copied from CRM #[id number]. Topic: Clinical - Prescription Issue >> Jan 16, 2025  3:32 PM Macario HERO wrote: Reason for CRM: Patient called said she needs a hard copy of her prescription: cyanocobalamin  (VITAMIN B12) 1000 MCG/ML injection [[id number]] because the [facility] will not allow her to bring in any outside medication. - Patient is requesting  follow up once it's ready.

## 2025-01-18 ENCOUNTER — Telehealth: Payer: Self-pay

## 2025-01-18 NOTE — Telephone Encounter (Signed)
 Copied from CRM #[id number]. Topic: Clinical - Prescription Issue >> Jan 16, 2025  3:32 PM Laura Howell wrote: Reason for CRM: Patient called said she needs a hard copy of her prescription: cyanocobalamin  (VITAMIN B12) 1000 MCG/ML injection [[id number]] because the [facility] will not allow her to bring in any outside medication. - Patient is requesting  follow up once it's ready. >> Jan 18, 2025  3:48 PM Laura Howell wrote: Pt is calling to follow up on request. [facility] is needing some type of documentation (something that says she has to get it once a month) so pt can get the B12 shot there. Please call pt. Pharmacy can't give her the prescription only minute clinic. She said that if a hard copy can be printed off she can have her husband to pick it up.

## 2025-01-24 NOTE — Telephone Encounter (Signed)
 Will you print the copy of the vit b12 rx for pt or her med list.  Thanks.

## 2025-01-25 NOTE — Telephone Encounter (Signed)
 Called patient left a VM, documentation is  at the front desk ready for pick-up

## 2025-01-26 ENCOUNTER — Ambulatory Visit: Admitting: Family Medicine

## 2025-01-26 ENCOUNTER — Encounter: Payer: Self-pay | Admitting: Family Medicine

## 2025-01-26 VITALS — BP 106/74 | HR 64 | Temp 99.0°F | Ht 62.0 in | Wt 132.8 lb

## 2025-01-26 DIAGNOSIS — L309 Dermatitis, unspecified: Secondary | ICD-10-CM | POA: Diagnosis not present

## 2025-01-26 DIAGNOSIS — R0981 Nasal congestion: Secondary | ICD-10-CM

## 2025-01-26 DIAGNOSIS — H938X2 Other specified disorders of left ear: Secondary | ICD-10-CM

## 2025-01-26 DIAGNOSIS — E538 Deficiency of other specified B group vitamins: Secondary | ICD-10-CM | POA: Diagnosis not present

## 2025-01-26 DIAGNOSIS — R051 Acute cough: Secondary | ICD-10-CM | POA: Diagnosis not present

## 2025-01-26 DIAGNOSIS — J069 Acute upper respiratory infection, unspecified: Secondary | ICD-10-CM

## 2025-01-26 LAB — POC COVID19 BINAXNOW: SARS Coronavirus 2 Ag: NEGATIVE

## 2025-01-26 LAB — POCT INFLUENZA A/B
Influenza A, POC: NEGATIVE
Influenza B, POC: NEGATIVE

## 2025-01-26 MED ORDER — CYANOCOBALAMIN 1000 MCG/ML IJ SOLN
1000.0000 ug | Freq: Once | INTRAMUSCULAR | Status: AC
  Administered 2025-01-26: 1000 ug via INTRAMUSCULAR

## 2025-01-26 NOTE — Progress Notes (Signed)
 "  Established Patient Office Visit   Subjective  Patient ID: Laura Howell, female    DOB: 24-Oct-1961  Age: 64 y.o. MRN: [id number]  Chief Complaint  Patient presents with   Acute Visit    B 12 shot, patient came in today for cough congestion, headache started 2 days ago     Patient is a 64 year old female seen for acute concern and B12 injection.  Patient endorses rare cough, rhinorrhea, nasal congestion, headache x 2 days.  Denies ear pain/pressure, facial pain/pressure, sore throat, fever.  Patient unaware of sick contacts.  Mentions continued congestion, like fluid in L ear.  Denies neck pain/pressure, popping in ear.  Patient previously requested a copy of her B12 prescription.  States minute clinic is requiring her to have it in order to give her monthly B12 injections.  Not allowing her to bring in her own vial of B12.  Notes pruritus.  Also with easy bruising and rash.  Seen by derm.  Has 3 creams from Derm.  Inquires if pruritus is related to alcohol intake.  Currently drinking 2 glasses of wine and 1 beer nightly.  States has cut down.    Patient Active Problem List   Diagnosis Date Noted   Elevated LFTs 04/15/2020   Alcohol use 04/15/2020   Acquired hypothyroidism 06/17/2007   Depression, recurrent (HCC) 06/17/2007   Past Medical History:  Diagnosis Date   B12 deficiency    Depression    Thyroid  disease    Past Surgical History:  Procedure Laterality Date   LAPAROSCOPY  1997   endrometriosis   Social History[1] Family History  Problem Relation Age of Onset   Cancer Father        Lung cancer   Heart disease Father    Heart disease Maternal Grandfather    Heart disease Paternal Grandfather    Allergies[2]  ROS Negative unless stated above    Objective:     BP 106/74 (BP Location: Left Arm, Patient Position: Sitting, Cuff Size: Normal)   Pulse 64   Temp 99 F (37.2 C) (Oral)   Ht 5' 2 (1.575 m)   Wt 132 lb 12.8 oz (60.2 kg)   SpO2 99%   BMI  24.29 kg/m  BP Readings from Last 3 Encounters:  01/26/25 106/74  07/20/24 118/80  12/15/23 122/78   Wt Readings from Last 3 Encounters:  01/26/25 132 lb 12.8 oz (60.2 kg)  07/20/24 134 lb (60.8 kg)  12/15/23 134 lb 3.2 oz (60.9 kg)      Physical Exam Constitutional:      General: She is not in acute distress.    Appearance: Normal appearance.  HENT:     Head: Normocephalic and atraumatic.     Comments: Bilateral TMs full.  No erythema or suppurative fluid.    Nose: Nose normal.     Mouth/Throat:     Mouth: Mucous membranes are moist.  Cardiovascular:     Rate and Rhythm: Normal rate and regular rhythm.     Heart sounds: Normal heart sounds. No murmur heard.    No gallop.  Pulmonary:     Effort: Pulmonary effort is normal. No respiratory distress.     Breath sounds: Normal breath sounds. No wheezing, rhonchi or rales.     Comments: Rare dry cough. Skin:    General: Skin is warm and dry.  Neurological:     Mental Status: She is alert and oriented to person, place, and time.  01/26/2025   10:20 AM 07/20/2024    5:05 PM 12/15/2023    4:01 PM  Depression screen PHQ 2/9  Decreased Interest 1  0  Down, Depressed, Hopeless 1 0 0  PHQ - 2 Score 2 0 0  Altered sleeping 0 0 0  Tired, decreased energy 3 1 0  Change in appetite  0 0  Feeling bad or failure about yourself  0 1 0  Trouble concentrating 0 0 0  Moving slowly or fidgety/restless 0 0 0  Suicidal thoughts 0 0 0  PHQ-9 Score 5 2  0   Difficult doing work/chores Not difficult at all Not difficult at all Not difficult at all     Data saved with a previous flowsheet row definition      01/26/2025   10:21 AM 12/15/2023    4:02 PM 06/24/2020    9:06 AM  GAD 7 : Generalized Anxiety Score  Nervous, Anxious, on Edge 0 0  0   Control/stop worrying 0 0  0   Worry too much - different things 0 0  0   Trouble relaxing 0 0  0   Restless 0 0  0   Easily annoyed or irritable 0 0  0   Afraid - awful might happen  0 0  0   Total GAD 7 Score 0 0 0  Anxiety Difficulty  Not difficult at all Not difficult at all     Data saved with a previous flowsheet row definition     Results for orders placed or performed in visit on 01/26/25  POC COVID-19 BinaxNow  Result Value Ref Range   SARS Coronavirus 2 Ag Negative Negative  POC Influenza A/B  Result Value Ref Range   Influenza A, POC Negative Negative   Influenza B, POC Negative Negative      Assessment & Plan:   Viral URI  Acute cough -     POC COVID-19 BinaxNow -     POCT Influenza A/B  Sinus congestion  Vitamin B12 deficiency -     Cyanocobalamin   Eczema, unspecified type  Congestion of left ear  Acute URI symptoms likely viral.  POC COVID and flu testing negative.  Continue supportive care with OTC cough/cold medications, rest, hydration, NSAIDs/Tylenol  sparingly.  Also try antihistamine to help with ear congestion.  Given sample of Allegra in clinic.  Vitamin B12 monthly injection given in clinic.  Patient picked up copy of the prescription to take to minute clinic for future administration of B12.  Discussed trying to self administer B12, patient declines at this time.  Eczematous patches on extremities and back.  Advised on supportive care including avoiding hot showers, applying moisturizer throughout the day, and using prescription steroid creams per dermatology.  Return if symptoms worsen or fail to improve.   Clotilda JONELLE Single, MD     [1]  Social History Tobacco Use   Smoking status: Never   Smokeless tobacco: Never  Vaping Use   Vaping status: Never Used  Substance Use Topics   Alcohol use: Yes    Comment: 2x/week   Drug use: Never  [2]  Allergies Allergen Reactions   Erythromycin Rash   Tetracyclines & Related Rash   "

## 2025-01-26 NOTE — Patient Instructions (Addendum)
 Your testing this visit for COVID and flu were negative.  It seems that your symptoms are currently due to a virus causing you to have a cold.  Continue taking over-the-counter medications as needed.  You can also try taking the antihistamine (allergy medication) Allegra to see if that helps with the ear congestion and the runny nose.  It is okay to use your prescription creams from the dermatologist for your rash.

## 2025-03-15 ENCOUNTER — Ambulatory Visit: Admitting: Dermatology
